# Patient Record
Sex: Male | Born: 1950 | Race: White | Hispanic: No | Marital: Single | Smoking: Former smoker
Health system: Southern US, Community
[De-identification: ages and names within clinical notes are randomized; demographics above are authoritative.]

## PROBLEM LIST (undated history)

## (undated) DIAGNOSIS — I82403 Acute embolism and thrombosis of unspecified deep veins of lower extremity, bilateral: Secondary | ICD-10-CM

## (undated) DIAGNOSIS — K219 Gastro-esophageal reflux disease without esophagitis: Secondary | ICD-10-CM

## (undated) DIAGNOSIS — M48062 Spinal stenosis, lumbar region with neurogenic claudication: Secondary | ICD-10-CM

## (undated) DIAGNOSIS — M109 Gout, unspecified: Secondary | ICD-10-CM

## (undated) DIAGNOSIS — F10939 Alcohol use, unspecified with withdrawal, unspecified: Secondary | ICD-10-CM

## (undated) DIAGNOSIS — F10239 Alcohol dependence with withdrawal, unspecified: Secondary | ICD-10-CM

## (undated) DIAGNOSIS — G9389 Other specified disorders of brain: Secondary | ICD-10-CM

## (undated) DIAGNOSIS — I480 Paroxysmal atrial fibrillation: Secondary | ICD-10-CM

## (undated) DIAGNOSIS — I10 Essential (primary) hypertension: Secondary | ICD-10-CM

## (undated) DIAGNOSIS — F1010 Alcohol abuse, uncomplicated: Secondary | ICD-10-CM

## (undated) DIAGNOSIS — I2510 Atherosclerotic heart disease of native coronary artery without angina pectoris: Secondary | ICD-10-CM

## (undated) DIAGNOSIS — I714 Abdominal aortic aneurysm, without rupture, unspecified: Secondary | ICD-10-CM

## (undated) DIAGNOSIS — C159 Malignant neoplasm of esophagus, unspecified: Secondary | ICD-10-CM

## (undated) DIAGNOSIS — I77810 Thoracic aortic ectasia: Secondary | ICD-10-CM

## (undated) DIAGNOSIS — R569 Unspecified convulsions: Secondary | ICD-10-CM

## (undated) DIAGNOSIS — E538 Deficiency of other specified B group vitamins: Secondary | ICD-10-CM

---

## 2011-08-31 HISTORY — PX: ESOPHAGECTOMY: SUR457

## 2015-08-31 DIAGNOSIS — G9389 Other specified disorders of brain: Secondary | ICD-10-CM

## 2015-08-31 HISTORY — DX: Other specified disorders of brain: G93.89

## 2016-03-30 DIAGNOSIS — I82403 Acute embolism and thrombosis of unspecified deep veins of lower extremity, bilateral: Secondary | ICD-10-CM

## 2016-03-30 HISTORY — DX: Acute embolism and thrombosis of unspecified deep veins of lower extremity, bilateral: I82.403

## 2016-06-11 HISTORY — PX: IVC FILTER INSERTION: CATH118245

## 2017-02-17 ENCOUNTER — Other Ambulatory Visit (HOSPITAL_COMMUNITY): Payer: Medicare Other

## 2017-02-17 ENCOUNTER — Inpatient Hospital Stay
Admit: 2017-02-17 | Discharge: 2017-03-08 | Disposition: A | Discharge status: Rehabilitation Facility | Payer: Medicare Other | Attending: Internal Medicine | Admitting: Internal Medicine

## 2017-02-17 DIAGNOSIS — Z4659 Encounter for fitting and adjustment of other gastrointestinal appliance and device: Secondary | ICD-10-CM

## 2017-02-17 DIAGNOSIS — J9690 Respiratory failure, unspecified, unspecified whether with hypoxia or hypercapnia: Secondary | ICD-10-CM

## 2017-02-17 HISTORY — DX: Acute embolism and thrombosis of unspecified deep veins of lower extremity, bilateral: I82.403

## 2017-02-17 HISTORY — DX: Gout, unspecified: M10.9

## 2017-02-17 HISTORY — DX: Abdominal aortic aneurysm, without rupture: I71.4

## 2017-02-17 HISTORY — DX: Paroxysmal atrial fibrillation: I48.0

## 2017-02-17 HISTORY — DX: Spinal stenosis, lumbar region with neurogenic claudication: M48.062

## 2017-02-17 HISTORY — DX: Thoracic aortic ectasia: I77.810

## 2017-02-17 HISTORY — DX: Alcohol use, unspecified with withdrawal, unspecified: F10.939

## 2017-02-17 HISTORY — DX: Deficiency of other specified B group vitamins: E53.8

## 2017-02-17 HISTORY — DX: Abdominal aortic aneurysm, without rupture, unspecified: I71.40

## 2017-02-17 HISTORY — DX: Malignant neoplasm of esophagus, unspecified: C15.9

## 2017-02-17 HISTORY — DX: Alcohol abuse, uncomplicated: F10.10

## 2017-02-17 HISTORY — DX: Other specified disorders of brain: G93.89

## 2017-02-17 HISTORY — DX: Unspecified convulsions: F10.239

## 2017-02-17 HISTORY — DX: Unspecified convulsions: R56.9

## 2017-02-17 HISTORY — DX: Essential (primary) hypertension: I10

## 2017-02-17 HISTORY — DX: Gastro-esophageal reflux disease without esophagitis: K21.9

## 2017-02-17 HISTORY — DX: Atherosclerotic heart disease of native coronary artery without angina pectoris: I25.10

## 2017-02-17 LAB — BLOOD GAS, ARTERIAL
ACID-BASE EXCESS: 4.8 mmol/L — AB (ref 0.0–2.0)
Bicarbonate: 28.6 mmol/L — ABNORMAL HIGH (ref 20.0–28.0)
FIO2: 35
O2 SAT: 97.1 %
PCO2 ART: 41.2 mmHg (ref 32.0–48.0)
PH ART: 7.456 — AB (ref 7.350–7.450)
PO2 ART: 88.3 mmHg (ref 83.0–108.0)
Patient temperature: 98.6

## 2017-02-18 ENCOUNTER — Other Ambulatory Visit (HOSPITAL_COMMUNITY): Payer: Medicare Other

## 2017-02-18 LAB — CBC
HEMATOCRIT: 26.6 % — AB (ref 39.0–52.0)
HEMOGLOBIN: 8.5 g/dL — AB (ref 13.0–17.0)
MCH: 30.8 pg (ref 26.0–34.0)
MCHC: 32 g/dL (ref 30.0–36.0)
MCV: 96.4 fL (ref 78.0–100.0)
Platelets: 375 10*3/uL (ref 150–400)
RBC: 2.76 MIL/uL — AB (ref 4.22–5.81)
RDW: 16.3 % — ABNORMAL HIGH (ref 11.5–15.5)
WBC: 6.5 10*3/uL (ref 4.0–10.5)

## 2017-02-18 LAB — COMPREHENSIVE METABOLIC PANEL
ALT: 10 U/L — ABNORMAL LOW (ref 17–63)
AST: 22 U/L (ref 15–41)
Albumin: 2.2 g/dL — ABNORMAL LOW (ref 3.5–5.0)
Alkaline Phosphatase: 135 U/L — ABNORMAL HIGH (ref 38–126)
Anion gap: 7 (ref 5–15)
BUN: 6 mg/dL (ref 6–20)
CHLORIDE: 95 mmol/L — AB (ref 101–111)
CO2: 28 mmol/L (ref 22–32)
Calcium: 8.7 mg/dL — ABNORMAL LOW (ref 8.9–10.3)
Creatinine, Ser: 0.52 mg/dL — ABNORMAL LOW (ref 0.61–1.24)
GFR calc non Af Amer: >60 mL/min (ref >60)
Glucose, Bld: 93 mg/dL (ref 65–99)
POTASSIUM: 4 mmol/L (ref 3.5–5.1)
SODIUM: 130 mmol/L — AB (ref 135–145)
Total Bilirubin: 0.7 mg/dL (ref 0.3–1.2)
Total Protein: 7.2 g/dL (ref 6.5–8.1)

## 2017-02-18 MED ORDER — IOPAMIDOL (ISOVUE-300) INJECTION 61%
INTRAVENOUS | Status: AC
  Filled 2017-02-18: qty 50

## 2017-02-18 MED ORDER — LIDOCAINE VISCOUS 2 % MT SOLN
15.0000 mL | Freq: Once | OROMUCOSAL | Status: AC
  Administered 2017-02-18: 3 mL via OROMUCOSAL

## 2017-02-18 MED ORDER — LIDOCAINE VISCOUS 2 % MT SOLN
OROMUCOSAL | Status: AC
  Filled 2017-02-18: qty 15

## 2017-02-18 MED ORDER — IOPAMIDOL (ISOVUE-300) INJECTION 61%
50.0000 mL | Freq: Once | INTRAVENOUS | Status: AC | PRN
  Administered 2017-02-18: 10 mL via ORAL

## 2017-02-24 ENCOUNTER — Other Ambulatory Visit (HOSPITAL_COMMUNITY): Payer: Medicare Other

## 2017-02-24 LAB — COMPREHENSIVE METABOLIC PANEL
ALBUMIN: 2.2 g/dL — AB (ref 3.5–5.0)
ALK PHOS: 133 U/L — AB (ref 38–126)
ALT: 14 U/L — ABNORMAL LOW (ref 17–63)
ANION GAP: 9 (ref 5–15)
AST: 22 U/L (ref 15–41)
BUN: 15 mg/dL (ref 6–20)
CALCIUM: 8.7 mg/dL — AB (ref 8.9–10.3)
CO2: 28 mmol/L (ref 22–32)
Chloride: 93 mmol/L — ABNORMAL LOW (ref 101–111)
Creatinine, Ser: 0.56 mg/dL — ABNORMAL LOW (ref 0.61–1.24)
GFR calc non Af Amer: >60 mL/min (ref >60)
Glucose, Bld: 101 mg/dL — ABNORMAL HIGH (ref 65–99)
POTASSIUM: 4.1 mmol/L (ref 3.5–5.1)
SODIUM: 130 mmol/L — AB (ref 135–145)
Total Bilirubin: 0.4 mg/dL (ref 0.3–1.2)
Total Protein: 7.6 g/dL (ref 6.5–8.1)

## 2017-02-24 LAB — CBC
HEMATOCRIT: 26.7 % — AB (ref 39.0–52.0)
Hemoglobin: 8.4 g/dL — ABNORMAL LOW (ref 13.0–17.0)
MCH: 30.1 pg (ref 26.0–34.0)
MCHC: 31.5 g/dL (ref 30.0–36.0)
MCV: 95.7 fL (ref 78.0–100.0)
PLATELETS: 297 10*3/uL (ref 150–400)
RBC: 2.79 MIL/uL — AB (ref 4.22–5.81)
RDW: 16.1 % — AB (ref 11.5–15.5)
WBC: 4.7 10*3/uL (ref 4.0–10.5)

## 2017-02-25 LAB — BASIC METABOLIC PANEL
ANION GAP: 5 (ref 5–15)
BUN: 14 mg/dL (ref 6–20)
CHLORIDE: 94 mmol/L — AB (ref 101–111)
CO2: 31 mmol/L (ref 22–32)
Calcium: 8.6 mg/dL — ABNORMAL LOW (ref 8.9–10.3)
Creatinine, Ser: 0.56 mg/dL — ABNORMAL LOW (ref 0.61–1.24)
GFR calc non Af Amer: >60 mL/min (ref >60)
Glucose, Bld: 120 mg/dL — ABNORMAL HIGH (ref 65–99)
POTASSIUM: 4.2 mmol/L (ref 3.5–5.1)
Sodium: 130 mmol/L — ABNORMAL LOW (ref 135–145)

## 2017-03-01 ENCOUNTER — Other Ambulatory Visit (HOSPITAL_COMMUNITY): Payer: Medicare Other

## 2017-03-01 LAB — BASIC METABOLIC PANEL
ANION GAP: 7 (ref 5–15)
BUN: 13 mg/dL (ref 6–20)
CHLORIDE: 101 mmol/L (ref 101–111)
CO2: 28 mmol/L (ref 22–32)
Calcium: 8.8 mg/dL — ABNORMAL LOW (ref 8.9–10.3)
Creatinine, Ser: 0.49 mg/dL — ABNORMAL LOW (ref 0.61–1.24)
GFR calc Af Amer: >60 mL/min (ref >60)
GFR calc non Af Amer: >60 mL/min (ref >60)
GLUCOSE: 93 mg/dL (ref 65–99)
POTASSIUM: 3.8 mmol/L (ref 3.5–5.1)
Sodium: 136 mmol/L (ref 135–145)

## 2017-03-01 LAB — CBC
HEMATOCRIT: 28.9 % — AB (ref 39.0–52.0)
HEMOGLOBIN: 8.9 g/dL — AB (ref 13.0–17.0)
MCH: 30 pg (ref 26.0–34.0)
MCHC: 30.8 g/dL (ref 30.0–36.0)
MCV: 97.3 fL (ref 78.0–100.0)
Platelets: 244 10*3/uL (ref 150–400)
RBC: 2.97 MIL/uL — AB (ref 4.22–5.81)
RDW: 16.4 % — ABNORMAL HIGH (ref 11.5–15.5)
WBC: 3.1 10*3/uL — AB (ref 4.0–10.5)

## 2017-03-08 ENCOUNTER — Encounter (HOSPITAL_COMMUNITY): Payer: Self-pay

## 2017-03-08 ENCOUNTER — Encounter: Payer: Self-pay | Admitting: Physical Medicine and Rehabilitation

## 2017-03-08 ENCOUNTER — Inpatient Hospital Stay (HOSPITAL_COMMUNITY)
Admission: RE | Admit: 2017-03-08 | Discharge: 2017-03-19 | DRG: 560 | Disposition: A | Discharge status: Home Health | Payer: Medicare Other | Source: Intra-hospital | Attending: Physical Medicine & Rehabilitation | Admitting: Physical Medicine & Rehabilitation

## 2017-03-08 DIAGNOSIS — S32059D Unspecified fracture of fifth lumbar vertebra, subsequent encounter for fracture with routine healing: Secondary | ICD-10-CM

## 2017-03-08 DIAGNOSIS — I714 Abdominal aortic aneurysm, without rupture: Secondary | ICD-10-CM | POA: Diagnosis present

## 2017-03-08 DIAGNOSIS — S12500D Unspecified displaced fracture of sixth cervical vertebra, subsequent encounter for fracture with routine healing: Principal | ICD-10-CM

## 2017-03-08 DIAGNOSIS — I2510 Atherosclerotic heart disease of native coronary artery without angina pectoris: Secondary | ICD-10-CM | POA: Diagnosis present

## 2017-03-08 DIAGNOSIS — Z95828 Presence of other vascular implants and grafts: Secondary | ICD-10-CM

## 2017-03-08 DIAGNOSIS — F329 Major depressive disorder, single episode, unspecified: Secondary | ICD-10-CM | POA: Diagnosis present

## 2017-03-08 DIAGNOSIS — S42002D Fracture of unspecified part of left clavicle, subsequent encounter for fracture with routine healing: Secondary | ICD-10-CM | POA: Diagnosis not present

## 2017-03-08 DIAGNOSIS — Z981 Arthrodesis status: Secondary | ICD-10-CM | POA: Diagnosis not present

## 2017-03-08 DIAGNOSIS — K219 Gastro-esophageal reflux disease without esophagitis: Secondary | ICD-10-CM | POA: Diagnosis present

## 2017-03-08 DIAGNOSIS — F419 Anxiety disorder, unspecified: Secondary | ICD-10-CM | POA: Diagnosis present

## 2017-03-08 DIAGNOSIS — R630 Anorexia: Secondary | ICD-10-CM

## 2017-03-08 DIAGNOSIS — I480 Paroxysmal atrial fibrillation: Secondary | ICD-10-CM | POA: Diagnosis present

## 2017-03-08 DIAGNOSIS — M109 Gout, unspecified: Secondary | ICD-10-CM | POA: Diagnosis present

## 2017-03-08 DIAGNOSIS — I959 Hypotension, unspecified: Secondary | ICD-10-CM

## 2017-03-08 DIAGNOSIS — D62 Acute posthemorrhagic anemia: Secondary | ICD-10-CM | POA: Diagnosis present

## 2017-03-08 DIAGNOSIS — S32039D Unspecified fracture of third lumbar vertebra, subsequent encounter for fracture with routine healing: Secondary | ICD-10-CM

## 2017-03-08 DIAGNOSIS — F17210 Nicotine dependence, cigarettes, uncomplicated: Secondary | ICD-10-CM | POA: Diagnosis present

## 2017-03-08 DIAGNOSIS — I82622 Acute embolism and thrombosis of deep veins of left upper extremity: Secondary | ICD-10-CM

## 2017-03-08 DIAGNOSIS — D709 Neutropenia, unspecified: Secondary | ICD-10-CM | POA: Diagnosis present

## 2017-03-08 DIAGNOSIS — I10 Essential (primary) hypertension: Secondary | ICD-10-CM | POA: Diagnosis present

## 2017-03-08 DIAGNOSIS — S32049D Unspecified fracture of fourth lumbar vertebra, subsequent encounter for fracture with routine healing: Secondary | ICD-10-CM | POA: Diagnosis not present

## 2017-03-08 DIAGNOSIS — F1010 Alcohol abuse, uncomplicated: Secondary | ICD-10-CM | POA: Diagnosis present

## 2017-03-08 DIAGNOSIS — Z86718 Personal history of other venous thrombosis and embolism: Secondary | ICD-10-CM

## 2017-03-08 DIAGNOSIS — T07XXXA Unspecified multiple injuries, initial encounter: Secondary | ICD-10-CM | POA: Diagnosis not present

## 2017-03-08 DIAGNOSIS — D72819 Decreased white blood cell count, unspecified: Secondary | ICD-10-CM

## 2017-03-08 DIAGNOSIS — S32029D Unspecified fracture of second lumbar vertebra, subsequent encounter for fracture with routine healing: Secondary | ICD-10-CM | POA: Diagnosis not present

## 2017-03-08 DIAGNOSIS — R5381 Other malaise: Secondary | ICD-10-CM | POA: Diagnosis present

## 2017-03-08 DIAGNOSIS — R7989 Other specified abnormal findings of blood chemistry: Secondary | ICD-10-CM

## 2017-03-08 LAB — BASIC METABOLIC PANEL
Anion gap: 9 (ref 5–15)
BUN: 16 mg/dL (ref 6–20)
CHLORIDE: 102 mmol/L (ref 101–111)
CO2: 24 mmol/L (ref 22–32)
CREATININE: 0.76 mg/dL (ref 0.61–1.24)
Calcium: 9 mg/dL (ref 8.9–10.3)
GFR calc Af Amer: >60 mL/min (ref >60)
GFR calc non Af Amer: >60 mL/min (ref >60)
GLUCOSE: 85 mg/dL (ref 65–99)
POTASSIUM: 3.9 mmol/L (ref 3.5–5.1)
SODIUM: 135 mmol/L (ref 135–145)

## 2017-03-08 LAB — CBC
HCT: 32.9 % — ABNORMAL LOW (ref 39.0–52.0)
HEMOGLOBIN: 10 g/dL — AB (ref 13.0–17.0)
MCH: 28.8 pg (ref 26.0–34.0)
MCHC: 30.4 g/dL (ref 30.0–36.0)
MCV: 94.8 fL (ref 78.0–100.0)
Platelets: 182 10*3/uL (ref 150–400)
RBC: 3.47 MIL/uL — ABNORMAL LOW (ref 4.22–5.81)
RDW: 16.1 % — ABNORMAL HIGH (ref 11.5–15.5)
WBC: 3.2 10*3/uL — ABNORMAL LOW (ref 4.0–10.5)

## 2017-03-08 MED ORDER — PROCHLORPERAZINE EDISYLATE 5 MG/ML IJ SOLN: 5.0000 mg | Freq: Four times a day (QID) | INTRAMUSCULAR | Status: DC | PRN

## 2017-03-08 MED ORDER — METHOCARBAMOL 750 MG PO TABS
750.0000 mg | ORAL_TABLET | Freq: Four times a day (QID) | ORAL | Status: DC
Start: 2017-03-08 — End: 2017-03-19
  Administered 2017-03-08 – 2017-03-19 (×36): 750 mg via ORAL
  Filled 2017-03-08 (×37): qty 1

## 2017-03-08 MED ORDER — PROCHLORPERAZINE MALEATE 5 MG PO TABS
5.0000 mg | ORAL_TABLET | Freq: Four times a day (QID) | ORAL | Status: DC | PRN
  Administered 2017-03-17: 10 mg via ORAL
  Filled 2017-03-08 (×2): qty 2

## 2017-03-08 MED ORDER — FAMOTIDINE 20 MG PO TABS
20.0000 mg | ORAL_TABLET | Freq: Two times a day (BID) | ORAL | Status: DC
  Administered 2017-03-08 – 2017-03-19 (×22): 20 mg via ORAL
  Filled 2017-03-08 (×22): qty 1

## 2017-03-08 MED ORDER — GUAIFENESIN-DM 100-10 MG/5ML PO SYRP: 5.0000 mL | ORAL_SOLUTION | Freq: Four times a day (QID) | ORAL | Status: DC | PRN

## 2017-03-08 MED ORDER — FOLIC ACID 1 MG PO TABS
1.0000 mg | ORAL_TABLET | Freq: Every day | ORAL | Status: DC
  Administered 2017-03-09 – 2017-03-19 (×11): 1 mg via ORAL
  Filled 2017-03-08 (×11): qty 1

## 2017-03-08 MED ORDER — PRO-STAT SUGAR FREE PO LIQD
30.0000 mL | Freq: Two times a day (BID) | ORAL | Status: DC
  Administered 2017-03-08 – 2017-03-16 (×16): 30 mL via ORAL
  Filled 2017-03-08 (×16): qty 30

## 2017-03-08 MED ORDER — QUETIAPINE FUMARATE 50 MG PO TABS
50.0000 mg | ORAL_TABLET | Freq: Every day | ORAL | Status: DC
  Administered 2017-03-08 – 2017-03-18 (×11): 50 mg via ORAL
  Filled 2017-03-08 (×10): qty 1

## 2017-03-08 MED ORDER — TRAMADOL HCL 50 MG PO TABS: 50.0000 mg | ORAL_TABLET | Freq: Four times a day (QID) | ORAL | Status: DC | PRN

## 2017-03-08 MED ORDER — DIPHENHYDRAMINE HCL 12.5 MG/5ML PO ELIX: 12.5000 mg | ORAL_SOLUTION | Freq: Four times a day (QID) | ORAL | Status: DC | PRN

## 2017-03-08 MED ORDER — BISACODYL 10 MG RE SUPP: 10.0000 mg | Freq: Every day | RECTAL | Status: DC | PRN

## 2017-03-08 MED ORDER — PAROXETINE HCL 20 MG PO TABS
20.0000 mg | ORAL_TABLET | Freq: Every day | ORAL | Status: DC
  Administered 2017-03-09 – 2017-03-19 (×11): 20 mg via ORAL
  Filled 2017-03-08 (×9): qty 1
  Filled 2017-03-08: qty 2
  Filled 2017-03-08 (×2): qty 1

## 2017-03-08 MED ORDER — MIDODRINE HCL 5 MG PO TABS
5.0000 mg | ORAL_TABLET | Freq: Three times a day (TID) | ORAL | Status: DC
  Administered 2017-03-09 – 2017-03-19 (×31): 5 mg via ORAL
  Filled 2017-03-08 (×31): qty 1

## 2017-03-08 MED ORDER — POLYETHYLENE GLYCOL 3350 17 G PO PACK
17.0000 g | PACK | Freq: Every day | ORAL | Status: DC
  Administered 2017-03-09 – 2017-03-18 (×5): 17 g via ORAL
  Filled 2017-03-08 (×10): qty 1

## 2017-03-08 MED ORDER — CLONAZEPAM 0.5 MG PO TABS
0.2500 mg | ORAL_TABLET | Freq: Two times a day (BID) | ORAL | Status: DC
  Administered 2017-03-08 – 2017-03-19 (×22): 0.25 mg via ORAL
  Filled 2017-03-08 (×23): qty 1

## 2017-03-08 MED ORDER — POLYETHYLENE GLYCOL 3350 17 G PO PACK: 17.0000 g | PACK | Freq: Every day | ORAL | Status: DC | PRN

## 2017-03-08 MED ORDER — AMIODARONE HCL 200 MG PO TABS
100.0000 mg | ORAL_TABLET | Freq: Every day | ORAL | Status: DC
  Administered 2017-03-09 – 2017-03-19 (×11): 100 mg via ORAL
  Filled 2017-03-08 (×11): qty 1

## 2017-03-08 MED ORDER — PROCHLORPERAZINE 25 MG RE SUPP
12.5000 mg | Freq: Four times a day (QID) | RECTAL | Status: DC | PRN
  Filled 2017-03-08: qty 1

## 2017-03-08 MED ORDER — SACCHAROMYCES BOULARDII 250 MG PO CAPS
250.0000 mg | ORAL_CAPSULE | Freq: Two times a day (BID) | ORAL | Status: DC
  Administered 2017-03-08 – 2017-03-19 (×22): 250 mg via ORAL
  Filled 2017-03-08 (×23): qty 1

## 2017-03-08 MED ORDER — TAB-A-VITE/IRON PO TABS
1.0000 | ORAL_TABLET | Freq: Every day | ORAL | Status: DC
  Administered 2017-03-09 – 2017-03-19 (×11): 1 via ORAL
  Filled 2017-03-08 (×11): qty 1

## 2017-03-08 MED ORDER — OXYCODONE HCL 5 MG PO TABS
5.0000 mg | ORAL_TABLET | Freq: Four times a day (QID) | ORAL | Status: DC | PRN
  Administered 2017-03-14: 5 mg via ORAL
  Filled 2017-03-08: qty 1

## 2017-03-08 MED ORDER — NAPROXEN 250 MG PO TABS
500.0000 mg | ORAL_TABLET | Freq: Two times a day (BID) | ORAL | Status: DC
  Administered 2017-03-09 – 2017-03-19 (×20): 500 mg via ORAL
  Filled 2017-03-08 (×21): qty 2

## 2017-03-08 MED ORDER — ALLOPURINOL 300 MG PO TABS
300.0000 mg | ORAL_TABLET | Freq: Every day | ORAL | Status: DC
  Administered 2017-03-09 – 2017-03-19 (×11): 300 mg via ORAL
  Filled 2017-03-08 (×4): qty 1
  Filled 2017-03-08: qty 3
  Filled 2017-03-08 (×6): qty 1

## 2017-03-08 MED ORDER — NICOTINE 21 MG/24HR TD PT24
21.0000 mg | MEDICATED_PATCH | Freq: Every day | TRANSDERMAL | Status: DC
  Administered 2017-03-09 – 2017-03-13 (×3): 21 mg via TRANSDERMAL
  Filled 2017-03-08 (×8): qty 1

## 2017-03-08 MED ORDER — VITAMIN B-1 100 MG PO TABS
100.0000 mg | ORAL_TABLET | Freq: Every day | ORAL | Status: DC
  Administered 2017-03-09 – 2017-03-19 (×11): 100 mg via ORAL
  Filled 2017-03-08 (×11): qty 1

## 2017-03-08 MED ORDER — APIXABAN 5 MG PO TABS
5.0000 mg | ORAL_TABLET | Freq: Two times a day (BID) | ORAL | Status: DC
  Administered 2017-03-08 – 2017-03-19 (×22): 5 mg via ORAL
  Filled 2017-03-08 (×22): qty 1

## 2017-03-08 MED ORDER — ACETAMINOPHEN 325 MG PO TABS
325.0000 mg | ORAL_TABLET | ORAL | Status: DC | PRN
  Filled 2017-03-08: qty 2

## 2017-03-08 MED ORDER — ALUM & MAG HYDROXIDE-SIMETH 200-200-20 MG/5ML PO SUSP: 30.0000 mL | ORAL | Status: DC | PRN

## 2017-03-08 MED ORDER — COLCHICINE 0.6 MG PO TABS
0.6000 mg | ORAL_TABLET | Freq: Two times a day (BID) | ORAL | Status: DC
  Administered 2017-03-08 – 2017-03-19 (×22): 0.6 mg via ORAL
  Filled 2017-03-08 (×22): qty 1

## 2017-03-08 MED ORDER — FLEET ENEMA 7-19 GM/118ML RE ENEM: 1.0000 | ENEMA | Freq: Once | RECTAL | Status: DC | PRN
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
## 2017-03-09 ENCOUNTER — Inpatient Hospital Stay (HOSPITAL_COMMUNITY): Payer: Medicare Other | Admitting: Occupational Therapy

## 2017-03-09 ENCOUNTER — Inpatient Hospital Stay (HOSPITAL_COMMUNITY): Payer: Medicare Other | Admitting: Physical Therapy

## 2017-03-09 ENCOUNTER — Encounter (HOSPITAL_COMMUNITY): Payer: Self-pay

## 2017-03-09 DIAGNOSIS — D72819 Decreased white blood cell count, unspecified: Secondary | ICD-10-CM

## 2017-03-09 DIAGNOSIS — R630 Anorexia: Secondary | ICD-10-CM

## 2017-03-09 DIAGNOSIS — I959 Hypotension, unspecified: Secondary | ICD-10-CM

## 2017-03-09 DIAGNOSIS — D62 Acute posthemorrhagic anemia: Secondary | ICD-10-CM

## 2017-03-09 LAB — COMPREHENSIVE METABOLIC PANEL WITH GFR
ALT: 19 U/L (ref 17–63)
AST: 31 U/L (ref 15–41)
Albumin: 2.9 g/dL — ABNORMAL LOW (ref 3.5–5.0)
Alkaline Phosphatase: 134 U/L — ABNORMAL HIGH (ref 38–126)
Anion gap: 7 (ref 5–15)
BUN: 17 mg/dL (ref 6–20)
CO2: 26 mmol/L (ref 22–32)
Calcium: 9.1 mg/dL (ref 8.9–10.3)
Chloride: 103 mmol/L (ref 101–111)
Creatinine, Ser: 0.73 mg/dL (ref 0.61–1.24)
GFR calc Af Amer: >60 mL/min
GFR calc non Af Amer: >60 mL/min
Glucose, Bld: 79 mg/dL (ref 65–99)
Potassium: 3.8 mmol/L (ref 3.5–5.1)
Sodium: 136 mmol/L (ref 135–145)
Total Bilirubin: 0.6 mg/dL (ref 0.3–1.2)
Total Protein: 7.9 g/dL (ref 6.5–8.1)

## 2017-03-09 LAB — CBC WITH DIFFERENTIAL/PLATELET
Basophils Absolute: 0 K/uL (ref 0.0–0.1)
Basophils Relative: 0 %
Eosinophils Absolute: 0.4 K/uL (ref 0.0–0.7)
Eosinophils Relative: 11 %
HCT: 32.8 % — ABNORMAL LOW (ref 39.0–52.0)
Hemoglobin: 10.1 g/dL — ABNORMAL LOW (ref 13.0–17.0)
Lymphocytes Relative: 39 %
Lymphs Abs: 1.2 K/uL (ref 0.7–4.0)
MCH: 29.4 pg (ref 26.0–34.0)
MCHC: 30.8 g/dL (ref 30.0–36.0)
MCV: 95.6 fL (ref 78.0–100.0)
Monocytes Absolute: 0.2 K/uL (ref 0.1–1.0)
Monocytes Relative: 6 %
Neutro Abs: 1.4 K/uL — ABNORMAL LOW (ref 1.7–7.7)
Neutrophils Relative %: 44 %
Platelets: 174 K/uL (ref 150–400)
RBC: 3.43 MIL/uL — ABNORMAL LOW (ref 4.22–5.81)
RDW: 16.4 % — ABNORMAL HIGH (ref 11.5–15.5)
WBC: 3.2 K/uL — ABNORMAL LOW (ref 4.0–10.5)

## 2017-03-09 MED ORDER — MEGESTROL ACETATE 40 MG PO TABS
40.0000 mg | ORAL_TABLET | Freq: Every day | ORAL | Status: DC
  Administered 2017-03-09 – 2017-03-19 (×11): 40 mg via ORAL
  Filled 2017-03-09 (×3): qty 1
  Filled 2017-03-09: qty 2
  Filled 2017-03-09 (×9): qty 1

## 2017-03-09 NOTE — Progress Notes (Signed)
Occupational Therapy Session Note  Patient Details  Name: Alec Rivers MRN: [id number] Date of Birth: 12/29/1950  Today's Date: 03/09/2017 OT Individual Time: 1100-1130 OT Individual Time Calculation (min): 30 min    Skilled Therapeutic Interventions/Progress Updates:    1:1 Therapeutic activity with focus on challenging balance, functional transfers and assessment of ROM and strength in UEs.  Pt able to perform stand pivot transfers with min A with more than reasonable amt of time.  Participated in [place] with many rest breaks and scoring a 22/56 indicating a high fall risk.  Pt with weakness in left hand with gross grasp but ROM (with neck restrictions) WFL. Pt transferred into bed at end of session with min A and steadying A to return to supine. Left with call bell.   Therapy Documentation Precautions:  Precautions Precautions: Fall Restrictions Weight Bearing Restrictions: Yes LUE Weight Bearing: Weight bearing as tolerated (Pt was NWB through 01/2017) General:   Vital Signs:   Pain:   ADL:   Vision   Perception    Praxis   Exercises:   Other Treatments:    See Function Navigator for Current Functional Status.   Therapy/Group: Individual Therapy  Willeen Cass [facility] 03/09/2017, 12:11 PM
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
## 2017-03-09 NOTE — Plan of Care (Signed)
Problem: RH BOWEL ELIMINATION Goal: RH STG MANAGE BOWEL WITH ASSISTANCE STG Manage Bowel with Assistance.  Outcome: Progressing Total assistance with cleaning and peri carel Goal: RH OTHER STG BOWEL ELIMINATION GOALS W/ASSIST Other STG Bowel Elimination Goals With Assistance.  Outcome: Progressing Total assistance

## 2017-03-09 NOTE — Progress Notes (Signed)
Occupational Therapy Session Note  Patient Details  Name: Alec Rivers MRN: [id number] Date of Birth: [birth year]  Today's Date: 03/09/2017 OT Individual Time: 0354-6568 OT Individual Time Calculation (min): 62 min    Short Term Goals: Week 1:  OT Short Term Goal 1 (Week 1): Pt will complete all bathing sit to stand with supervision. OT Short Term Goal 2 (Week 1): Pt will complete LB dressing sit to stand with supervision. OT Short Term Goal 3 (Week 1): Pt will complete toilet transfers with supervision using the RW for support. OT Short Term Goal 4 (Week 1): Pt will complete shower/tub transfers with min assist using RW and tub bench.   OT Short Term Goal 5 (Week 1): Pt will complete LUE FM/strengthening exercises with supervision.    Skilled Therapeutic Interventions/Progress Updates:   Pt completed toilet transfer with min hand held assist and also worked on grooming task of brushing his teeth at the sink in standing with min assist as well.  He needed seated rest break after completion of these tasks.  He was able to propel his wheelchair down to the therapy gym with supervision.  Once in the gym he worked on [employer] coordination and Retail banker.  He demonstrated grip strength on the left side of 13 Lbs and on the right of 52 lbs.  Nine hole peg test 41 secs on the right and 56 on the left.  Pt with theraputty in his room, will issue written exercises for this next session.  Returned to room at end of session with pt in bed and bed alarm in place.  Call button and phone in reach.    Therapy Documentation Precautions:  Precautions Precautions: Fall Required Braces or Orthoses: Cervical Brace Cervical Brace: Hard collar, At all times Restrictions Weight Bearing Restrictions: No LUE Weight Bearing: Weight bearing as tolerated  Pain: Pain Assessment Pain Assessment: No/denies pain ADL: See Function Navigator for Current Functional Status.   Therapy/Group: Individual  Therapy  Dale Strausser OTR/L 03/09/2017, 5:26 PM
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
## 2017-03-09 NOTE — Care Management Note (Signed)
Inpatient [place] Individual Statement of Services  Patient Name:  Alec Rivers  Date:  03/09/2017  Welcome to the [facility].  Our goal is to provide you with an individualized program based on your diagnosis and situation, designed to meet your specific needs.  With this comprehensive rehabilitation program, you will be expected to participate in at least 3 hours of rehabilitation therapies Monday-Friday, with modified therapy programming on the weekends.  Your rehabilitation program will include the following services:  Physical Therapy (PT), Occupational Therapy (OT), Speech Therapy (ST), 24 hour per day rehabilitation nursing, Neuropsychology, Case Management (Social Worker), Rehabilitation Medicine, Nutrition Services and Pharmacy Services  Weekly team conferences will be held on Wednesday to discuss your progress.  Your Social Worker will talk with you frequently to get your input and to update you on team discussions.  Team conferences with you and your family in attendance may also be held.  Expected length of stay: 7-10 days  Overall anticipated outcome: supervision set up  Depending on your progress and recovery, your program may change. Your Social Worker will coordinate services and will keep you informed of any changes. Your Social Worker's name and contact numbers are listed  below.  The following services may also be recommended but are not provided by the [facility] will be made to provide these services after discharge if needed.  Arrangements include referral to agencies that provide these services.  Your insurance has been verified to be:  Medicare Your primary doctor is:    Pertinent information will be shared with your doctor and your insurance company.  Social Worker:  Ovidio Kin, [place] or [phone]  Information discussed with and copy given to patient by: Elease Hashimoto, 03/09/2017, 1:27 PM

## 2017-03-09 NOTE — Discharge Instructions (Addendum)
Inpatient Rehab Discharge Instructions  Alec Rivers Discharge date and time:  03/18/17  Activities/Precautions/ Functional Status: Activity: no lifting, driving, or strenuous exercise for till cleared by MD Diet: cardiac diet--Need to drink at least 5 glasses of water daily.  Wound Care: keep wound clean and dry   Functional status:  ___ No restrictions     ___ Walk up steps independently ___ 24/7 supervision/assistance   ___ Walk up steps with assistance _X__ Intermittent supervision/assistance  __X_ Bathe/dress independently _X__ Walk with walker    ___ Bathe/dress with assistance ___ Walk Independently    ___ Shower independently ___ Walk with assistance    _X__ Shower with assistance _X__ No alcohol or tobacco    ___ Return to work/school ________  Special Instructions: 1. Take your medications as prescribed. Do not skip doses.  2. Wear collar at all times. Contact Neurosurgery for follow up appointment and input on when you can remove the collar.     COMMUNITY REFERRALS UPON DISCHARGE:    Home Health:   PT, OT, RN   Agency:[facility] IOXBD:[phone]   Date of last service:03/19/2017  Medical Equipment/Items Ordered:ROLLING WALKER, [place]   [phone]  OTHER: DECLINED ETOH RESOURCES  My questions have been answered and I understand these instructions. I will adhere to these goals and the provided educational materials after my discharge from the hospital.  Patient/Caregiver Signature _______________________________ Date __________  Clinician Signature _______________________________________ Date __________  Please bring this form and your medication list with you to all your follow-up doctor's appointments.   Information on my medicine - ELIQUIS (apixaban) This medication education was reviewed with me or my healthcare representative as part of my discharge preparation.    Why was  Eliquis prescribed for you? Eliquis was prescribed for you to reduce the risk of a blood clot forming that can cause a stroke if you have a medical condition called atrial fibrillation (a type of irregular heartbeat).  What do You need to know about Eliquis ? Take your Eliquis  5 mg TWICE DAILY - one tablet in the morning and one tablet in the evening with or without food. If you have difficulty swallowing the tablet whole please discuss with your pharmacist how to take the medication safely.  Take Eliquis exactly as prescribed by your doctor and DO NOT stop taking Eliquis without talking to the doctor who prescribed the medication.  Stopping may increase your risk of developing a stroke.  Refill your prescription before you run out.  After discharge, you should have regular check-up appointments with your healthcare provider that is prescribing your Eliquis.  In the future your dose may need to be changed if your kidney function or weight changes by a significant amount or as you get older.  What do you do if you miss a dose? If you miss a dose, take it as soon as you remember on the same day and resume taking twice daily.  Do not take more than one dose of ELIQUIS at the same time to make up a missed dose.  Important Safety Information A possible side effect of Eliquis is bleeding. You should call your healthcare provider right away if you experience any of the following: ? Bleeding from an injury or your nose that does not stop. ? Unusual colored urine (red or dark brown) or unusual colored stools (red or black). ? Unusual bruising for unknown reasons. ? A serious fall or if you  hit your head (even if there is no bleeding).  Some medicines may interact with Eliquis and might increase your risk of bleeding or clotting while on Eliquis. To help avoid this, consult your healthcare provider or pharmacist prior to using any new prescription or non-prescription medications, including  herbals, vitamins, non-steroidal anti-inflammatory drugs (NSAIDs) and supplements.  This website has more information on Eliquis (apixaban): http://www.eliquis.com/eliquis/home

## 2017-03-09 NOTE — Plan of Care (Signed)
Problem: RH BOWEL ELIMINATION Goal: RH STG MANAGE BOWEL WITH ASSISTANCE STG Manage Bowel with Assistance.  Outcome: Not Progressing Pt total requires total assist/incontinent

## 2017-03-10 ENCOUNTER — Inpatient Hospital Stay (HOSPITAL_COMMUNITY): Payer: Medicare Other | Admitting: *Deleted

## 2017-03-10 ENCOUNTER — Inpatient Hospital Stay (HOSPITAL_COMMUNITY): Payer: Medicare Other

## 2017-03-10 ENCOUNTER — Inpatient Hospital Stay (HOSPITAL_COMMUNITY): Payer: Medicare Other | Admitting: Occupational Therapy

## 2017-03-10 DIAGNOSIS — I82622 Acute embolism and thrombosis of deep veins of left upper extremity: Secondary | ICD-10-CM
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
## 2017-03-10 NOTE — Progress Notes (Signed)
Patient safety maintained throughout the shift.  Patient rested well, denies needs/concerns at this time.

## 2017-03-10 NOTE — Progress Notes (Signed)
Physical Therapy Session Note  Patient Details  Name: Alec Rivers MRN: [id number] Date of Birth: Dec 20, 1950  Today's Date: 03/10/2017 PT Individual Time: 0800-0900 PT Individual Time Calculation (min): 60 min   Short Term Goals: Week 1:  PT Short Term Goal 1 (Week 1): STG=LTG due to short length of stay   Skilled Therapeutic Interventions/Progress Updates:    Pt supine in bed upon PT arrival, agreeable to therapy tx. Pt transferred from supine to sitting EOB with supervision, HOB elevated and using handrails. Sitting edge of bed, worked on dynamic sitting balance in order to get dressed. Pt donned socks, shirt, pants and shoes with supervision. Min assist to stand and pull shorts up all the way. Pt transferred from bed to w/c stand step transfer with RW and supervision. Worked on LE strengthening, pt performed 2 x 10 LAQ and 2 x 10 seated marches. Pt ambulated x 109 ft using RW and min assist for balance, verbal cues for trunk extension and walker placement. Worked on navigating obstacles, ambulating around cones with RW, min assist for balance pt with difficulty turning, taking small steps and increased time. Worked on standing balance without UE support on foam 2 x30 sec with eyes open and 2 x 30 seconds with eyes closed, increased lateral sway with eyes closed. Pt left in w/c in room with call bell in reach.   Therapy Documentation Precautions:  Precautions Precautions: Fall Required Braces or Orthoses: Cervical Brace Cervical Brace: Hard collar, At all times Restrictions Weight Bearing Restrictions: No LUE Weight Bearing: Weight bearing as tolerated Pain:  Pt denies pain this session.    See Function Navigator for Current Functional Status.   Therapy/Group: Individual Therapy  Netta Corrigan, PT, DPT 03/10/2017, 8:32 AM
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
## 2017-03-10 NOTE — Progress Notes (Signed)
Spoke with Dr. Dois Davenport nurse and updated her on patient's progress. She reported that typical time in collar is 12 weeks and follow up prior to d/c of collar.  He can have it off when neck supported in chair  or HOB < 30 degrees for therapy only. Cannot be left alone without the collar.
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
## 2017-03-10 NOTE — Progress Notes (Addendum)
Occupational Therapy Session Note  Patient Details  Name: Alec Rivers MRN: [id number] Date of Birth: Sep 16, 1950  Today's Date: 03/10/2017 OT Individual Time: 1340-1457 OT Individual Time Calculation (min): 77 min    Short Term Goals: Week 1:  OT Short Term Goal 1 (Week 1): Pt will complete all bathing sit to stand with supervision. OT Short Term Goal 2 (Week 1): Pt will complete LB dressing sit to stand with supervision. OT Short Term Goal 3 (Week 1): Pt will complete toilet transfers with supervision using the RW for support. OT Short Term Goal 4 (Week 1): Pt will complete shower/tub transfers with min assist using RW and tub bench.   OT Short Term Goal 5 (Week 1): Pt will complete LUE FM/strengthening exercises with supervision.    Skilled Therapeutic Interventions/Progress Updates:    Pt received sitting up in w/c ready for OT treatment session. Focus of session on LUE fine motor control and strengthening. Issued Pt theraputty exercises with Pt reading and following instructions with Min verbal cues. Pt with theraputty from recent hospital stay, requires increased time to complete each exercise fully while seated in w/c. Pt requires intermittent rest breaks during activity completion due to increased L UE fatigue. Issued beads for use in theraputty with Pt grasping beads from table and pushing into putty using L UE. Pt then self propelled w/c approx 70 feet for UE strengthening towards dayroom with therapist assisting with transport to ultimately reach dayroom. Pt completed seated tabletop activity with small pvc pipe, using LUE to build figures from corresponding diagrams. Min verbal cues for utilizing L UE during completion. Transported Pt back to room in manner described above where Pt was left seated in w/c, call bell and needs within reach.     Therapy Documentation Precautions:  Precautions Precautions: Fall Required Braces or Orthoses: Cervical Brace Cervical Brace: Hard  collar, At all times Restrictions Weight Bearing Restrictions: No LUE Weight Bearing: Weight bearing as tolerated  Pain: Pain Assessment Pain Assessment: Faces Faces Pain Scale: No hurt Pain Type: Acute pain Pain Location: Hand Pain Orientation: Left Pain Descriptors / Indicators: Sore Pain Onset: With Activity Pain Intervention(s): Repositioned;Rest  See Function Navigator for Current Functional Status.   Therapy/Group: Individual Therapy  Raymondo Band 03/10/2017, 4:28 PM

## 2017-03-11 ENCOUNTER — Inpatient Hospital Stay (HOSPITAL_COMMUNITY): Payer: Medicare Other

## 2017-03-11 ENCOUNTER — Inpatient Hospital Stay (HOSPITAL_COMMUNITY): Payer: Medicare Other | Admitting: Occupational Therapy

## 2017-03-11 MED ORDER — ENSURE ENLIVE PO LIQD
237.0000 mL | Freq: Two times a day (BID) | ORAL | Status: DC
  Administered 2017-03-11 – 2017-03-18 (×13): 237 mL via ORAL
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
## 2017-03-11 NOTE — Progress Notes (Signed)
Occupational Therapy Session Note  Patient Details  Name: Alec Rivers MRN: [id number] Date of Birth: 11/06/1950  Today's Date: 03/11/2017 OT Individual Time: 5462-7035 OT Individual Time Calculation (min): 56 min    Short Term Goals: Week 1:  OT Short Term Goal 1 (Week 1): Pt will complete all bathing sit to stand with supervision. OT Short Term Goal 2 (Week 1): Pt will complete LB dressing sit to stand with supervision. OT Short Term Goal 3 (Week 1): Pt will complete toilet transfers with supervision using the RW for support. OT Short Term Goal 4 (Week 1): Pt will complete shower/tub transfers with min assist using RW and tub bench.   OT Short Term Goal 5 (Week 1): Pt will complete LUE FM/strengthening exercises with supervision.    Skilled Therapeutic Interventions/Progress Updates: Patient seated in w/c upon approach for OT.   Thought Patient stated he was very fatigued from yesterdays therapies, he concurred to work on seated and standing endurance via various venues such as 'backwards arm bicycles.'     He was able to complete sit to stand (somewhat stooped posture) activities for endurance training to increase functional mobility and self care - standing approximately 6 seconds before stating he was tired and needed to sit.   Patient took some time to swallow his meds in an applesauce container.   He stated that neither he nor his two sisters can swallow meds. And that he had "a load" of pills in his applesauce  Patient asked for this clinician to repeat words and when asked if needed this clinician to slow down, speak louder, lower pitch, repeat, or other, he stated, "I need for people to speak louder.   I really need hearing aids and will get some one of these days."  Patient asked to ly to rest at end of session.   He transferred with close S and exra time and was left with his call bell and phone within reach.     Therapy Documentation Precautions:   Precautions Precautions: Fall Required Braces or Orthoses: Cervical Brace Cervical Brace: Hard collar, At all times Restrictions Weight Bearing Restrictions: No LUE Weight Bearing: Weight bearing as tolerated  Pain:5/10 "only when I move it" received his scheduled pain meds    See Function Navigator for Current Functional Status.   Therapy/Group: Individual Therapy  Alfredia Ferguson [facility] 03/11/2017, 8:49 AM

## 2017-03-11 NOTE — Progress Notes (Signed)
[place] PHYSICAL MEDICINE & REHABILITATION     PROGRESS NOTE  Subjective/Complaints:  Pt seen sitting up in his chair this AM, working with therapies. He slept well overnight.  He has questions about his C-collar.   ROS: Denies CP, SOB, N/V/D.  Objective: Vital Signs: Blood pressure 114/79, pulse 78, temperature 98.4 F (36.9 C), temperature source Oral, resp. rate 16, height 6' (1.829 m), weight 77 kg (169 lb 12.1 oz), SpO2 96 %. No results found.  Recent Labs  03/09/17 0606  WBC 3.2*  HGB 10.1*  HCT 32.8*  PLT 174    Recent Labs  03/09/17 0606  NA 136  K 3.8  CL 103  GLUCOSE 79  BUN 17  CREATININE 0.73  CALCIUM 9.1   CBG (last 3)  No results for input(s): GLUCAP in the last 72 hours.  Wt Readings from Last 3 Encounters:  03/09/17 77 kg (169 lb 12.1 oz)  03/08/17 78.9 kg (174 lb)    Physical Exam:  BP 114/79 (BP Location: Left Arm)   Pulse 78   Temp 98.4 F (36.9 C) (Oral)   Resp 16   Ht 6' (1.829 m) Comment: 6 ft  Wt 77 kg (169 lb 12.1 oz)   SpO2 96%   BMI 23.02 kg/m  Constitutional: He appears well-developedand well-nourished. NAD. HENT: Normocephalicand atraumatic.  Eyes: EOMare normal. No discharge.  Neck: +Cervical collar in place Cardiovascular: RRR. +Murmurheard. No JVD. Respiratory: Effort normal breath sounds normal.  GI: Soft. Bowel sounds are normal.  Musculoskeletal: He exhibits no edemaor tenderness.  Neurological: He is alertand oriented.  Mild hearing loss.  Motor: RUE 4/5 prox to distal.  LUE; 4/5 proximal to distal (weaker than right with pain inhibition, stable) Bilateral LE: 3+/5 HF, KE and 4-/5 ADF/PF (stable).  Skin: Skin is warmand dry. BLE with stasis changes. Psychiatric: He has a normal mood and affect. His behavior is normal.   Assessment/Plan: 1. Functional deficits secondary to debility which require 3+ hours per day of interdisciplinary therapy in a comprehensive inpatient rehab setting. Physiatrist is  providing close team supervision and 24 hour management of active medical problems listed below. Physiatrist and rehab team continue to assess barriers to discharge/monitor patient progress toward functional and medical goals.  Function:  Bathing Bathing position   Position: Shower  Bathing parts Body parts bathed by patient: Right arm, Left arm, Chest, Abdomen, Front perineal area, Buttocks, Right upper leg, Left upper leg, Right lower leg, Left lower leg Body parts bathed by helper: Back  Bathing assist Assist Level: Touching or steadying assistance(Pt > 75%)      Upper Body Dressing/Undressing Upper body dressing   What is the patient wearing?: Pull over shirt/dress     Pull over shirt/dress - Perfomed by patient: Thread/unthread right sleeve, Thread/unthread left sleeve, Put head through opening, Pull shirt over trunk          Upper body assist        Lower Body Dressing/Undressing Lower body dressing   What is the patient wearing?: Pants, Socks, Shoes     Pants- Performed by patient: Thread/unthread right pants leg, Thread/unthread left pants leg, Pull pants up/down, Fasten/unfasten pants   Non-skid slipper socks- Performed by patient: Don/doff right sock, Don/doff left sock   Socks - Performed by patient: Don/doff right sock, Don/doff left sock   Shoes - Performed by patient: Don/doff right shoe, Don/doff left shoe, Fasten right, Fasten left  Lower body assist Assist for lower body dressing: Touching or steadying assistance (Pt > 75%)      Toileting Toileting   Toileting steps completed by patient: Adjust clothing prior to toileting, Performs perineal hygiene, Adjust clothing after toileting Toileting steps completed by helper: Adjust clothing after toileting Toileting Assistive Devices: Grab bar or rail  Toileting assist Assist level: Touching or steadying assistance (Pt.75%)   Transfers Chair/bed transfer   Chair/bed transfer method: Stand  pivot, Ambulatory Chair/bed transfer assist level: Touching or steadying assistance (Pt > 75%) Chair/bed transfer assistive device: Armrests, Medical sales representative     Max distance: 109 Assist level: Touching or steadying assistance (Pt > 75%)   Wheelchair   Type: Manual Max wheelchair distance: 75 Assist Level: Supervision or verbal cues  Cognition Comprehension Comprehension assist level: Follows basic conversation/direction with no assist  Expression Expression assist level: Expresses basic needs/ideas: With no assist  Social Interaction Social Interaction assist level: Interacts appropriately 90% of the time - Needs monitoring or encouragement for participation or interaction.  Problem Solving Problem solving assist level: Solves basic 90% of the time/requires cueing < 10% of the time  Memory Memory assist level: Recognizes or recalls 90% of the time/requires cueing < 10% of the time    Medical Problem List and Plan: 1. Functional and mobility deficitssecondary to polytrauma/debility after multiple medical  Cont CIR  C-collar for 12 weeks (~8/25), can take off with therapies only if supported  WBAT LUE 2. LLE/LUE DVT /Anticoagulation: Pharmaceutical: Other (comment)Eliquis 3. Pain Management: tylenol prn 4. Mood: LCSW to follow for evaluation and support 5. Neuropsych: This patient is not fullycapable of making decisions on hisown behalf. 6. Skin/Wound Care: Monitor stoma for healing. Maintain adequate nutritional and hydration status.  7. Fluids/Electrolytes/Nutrition: Monitor I/Os  BMP within acceptable range on 7/11  Megace started 7/11, slightly improved  Labs ordered for Monday 8. Hypotension: Monitor BP bid--continue midodrine  Controlled 7/13 9. A fib: In NSR on amiodarone. 10. Anxiety/depression: Managed by Paxil, Seroquel at bedtime and klonopin bid  11. H/o gout with flare ups: On allopurinol and Colcyrys  12. ABLA: Resolving. Will continue to  monitor.   Hb 10.1 on 7/11  Cont to monitor  Labs ordered for Monday 13. Leukopenia: Question medication related.   WBCs 3.2 on 7/11  Labs ordered for Monday  Cont to monitor   LOS (Days) 3 A FACE TO FACE EVALUATION WAS PERFORMED  Myracle Febres Lorie Phenix 03/11/2017 8:04 AM

## 2017-03-11 NOTE — Progress Notes (Signed)
Occupational Therapy Session Note  Patient Details  Name: Alec Rivers MRN: [id number] Date of Birth: June 30, 1951  Today's Date: 03/11/2017 OT Individual Time: 2919-1660 OT Individual Time Calculation (min): 56 min    Short Term Goals: Week 1:  OT Short Term Goal 1 (Week 1): Pt will complete all bathing sit to stand with supervision. OT Short Term Goal 2 (Week 1): Pt will complete LB dressing sit to stand with supervision. OT Short Term Goal 3 (Week 1): Pt will complete toilet transfers with supervision using the RW for support. OT Short Term Goal 4 (Week 1): Pt will complete shower/tub transfers with min assist using RW and tub bench.   OT Short Term Goal 5 (Week 1): Pt will complete LUE FM/strengthening exercises with supervision.    Skilled Therapeutic Interventions/Progress Updates:    1:1. No c/o pain, just "soreness." RN aware. Focus of session standing balance, endurance, LUE use, LUE FMC and ambulation with RW in context of ADL. Pt bathes at sit to stand level at sink with supervision for UB bathing and CGA for LB bathing. Pt dons UB clothing with superversion and LB clothing with CGA assuming seated figure 4 to thread pants/put on socks/shoes. Pt ambulates with RW throughout session with supervision for VC for RW management with 1 LOB to R as pt turns and trips over foot with MOD A to correct. Pt toilets with supervision and VC for safety awareness. Exited session with pt seated in w/c with call light in reach and set up for breakfast.  Therapy Documentation Precautions:  Precautions Precautions: Fall Required Braces or Orthoses: Cervical Brace Cervical Brace: Hard collar, At all times Restrictions Weight Bearing Restrictions: No LUE Weight Bearing: Weight bearing as tolerated  See Function Navigator for Current Functional Status.   Therapy/Group: Individual Therapy  Tonny Branch 03/11/2017, 9:05 AM

## 2017-03-11 NOTE — Progress Notes (Signed)
Physical Therapy Session Note  Patient Details  Name: Alec Rivers MRN: [id number] Date of Birth: [birth year]  Today's Date: 03/11/2017 PT Individual Time: 1030-1130 PT Individual Time Calculation (min): 60 min   Short Term Goals: Week 1:  PT Short Term Goal 1 (Week 1): STG=LTG due to short length of stay   Skilled Therapeutic Interventions/Progress Updates:    Pt supine in bed upon PT arrival, agreeable to therapy tx. Pt transferred from supine to sitting EOB with supervision, pt transferred from bed to w/c stand step with RW and min assist for balance. Pt propelled w/c 150 ft to gym using B LEs with supervision and increased time to complete. Pt transferred from w/c to nustep, stand step transfer without AD and min assist for balance. Pt used nustep x 6 min with B UEs and LEs to work on endurance and ROM. Pt standing without UE support worked on stepping in different directions forward, sideways and backwards in order to step on number called out to work on dynamic balance, min assist for balance. Worked on dynamic balance standing on foam without UE support while reaching in different directions with UEs and throwing/catching beach ball, pt with increased posterior lean, self corrected after verbal cues. Worked on stepping over objects and turning around cones on obstacle course, without AD and min assist for balance x 3. Pt with difficulty stepping over to clear hockey steps, improved when given verbal cues to take a larger step. Pt ascended/descended steps using single handrail, step to pattern with min assist for balance. Pt ambulated part way back to room x 65 ft and then rode in w/c rest of way d/t fatigue. Pt transferred from w/c back to bed, stand step transfer with min assist. Pt transferred from sitting EOB to supine with supervision. Pt left supine in bed with call bell in reach.   Pt denies pain this session   Therapy Documentation Precautions:  Precautions Precautions:  Fall Required Braces or Orthoses: Cervical Brace Cervical Brace: Hard collar, At all times Restrictions Weight Bearing Restrictions: No LUE Weight Bearing: Weight bearing as tolerated   See Function Navigator for Current Functional Status.   Therapy/Group: Individual Therapy  Netta Corrigan, PT, DPT 03/11/2017, 10:52 AM

## 2017-03-12 NOTE — Progress Notes (Signed)
Patient ID: Alec Rivers, male   DOB: Oct 19, 1950, 66 y.o.   MRN: [id number]   03/12/17.  Alec Rivers is a 66 y.o. male  Admit for CIR with functional and mobility deficitssecondary to polytrauma/debility   Subjective: No new complaints. No new problems. Slept well. Remains weak.  No focal complaints.  Past Medical History:  Diagnosis Date  . Abdominal aortic aneurysm (AAA), 30-34 mm diameter (HCC)   . Alcohol abuse   . Alcohol withdrawal seizure with complication ([place])   . Aortic root dilation (H[facility])   . CAD (coronary artery disease)   . DVT, bilateral lower limbs ([place]) 03/2016  . Esophageal adenocarcinoma ([place])    treated with chemoradiation  . GERD (gastroesophageal reflux disease)   . Gout   . HTN (hypertension)   . PAF (paroxysmal atrial fibrillation) ([place])    s/p ablation 2013  . Spinal stenosis, lumbar region with neurogenic claudication   . Subdural fluid collection 2017   Fall/coumadin/Etoh abuse  . Vitamin B 12 deficiency    supplemented by B 12 injections     Objective: Vital signs in last 24 hours: Temp:  [97.8 F (36.6 C)-98.2 F (36.8 C)] 97.8 F (36.6 C) (07/14 0500) Pulse Rate:  [86-90] 86 (07/14 0500) Resp:  [16] 16 (07/14 0500) BP: (117-127)/(78) 127/78 (07/14 0500) SpO2:  [96 %-98 %] 96 % (07/14 0500) Weight change:  Last BM Date: 03/11/17  Intake/Output from previous day: 07/13 0701 - 07/14 0700 In: 400 [P.O.:400] Out: 150 [Urine:150] Last cbgs: CBG (last 3)  No results for input(s): GLUCAP in the last 72 hours.  BP Readings from Last 3 Encounters:  03/12/17 127/78    Physical Exam General: No apparent distress  But weak HEENT: not dry; C Collar in place Lungs: Normal effort. Lungs clear to auscultation, no crackles or wheezes. Cardiovascular: Regular rate and rhythm, no edema; Gr 3/6 systolic and Gr 3/6 diastolic murmurs Abdomen: S/NT/ND; BS(+) Musculoskeletal:  unchanged Neurological: No new neurological deficits  Skin:  clear  Stasis changes Mental state: Alert, oriented, cooperative    Lab Results: BMET    Component Value Date/Time   NA 136 03/09/2017 0606   K 3.8 03/09/2017 0606   CL 103 03/09/2017 0606   CO2 26 03/09/2017 0606   GLUCOSE 79 03/09/2017 0606   BUN 17 03/09/2017 0606   CREATININE 0.73 03/09/2017 0606   CALCIUM 9.1 03/09/2017 0606   GFRNONAA >60 03/09/2017 0606   GFRAA >60 03/09/2017 0606   CBC    Component Value Date/Time   WBC 3.2 (L) 03/09/2017 0606   RBC 3.43 (L) 03/09/2017 0606   HGB 10.1 (L) 03/09/2017 0606   HCT 32.8 (L) 03/09/2017 0606   PLT 174 03/09/2017 0606   MCV 95.6 03/09/2017 0606   MCH 29.4 03/09/2017 0606   MCHC 30.8 03/09/2017 0606   RDW 16.4 (H) 03/09/2017 0606   LYMPHSABS 1.2 03/09/2017 0606   MONOABS 0.2 03/09/2017 0606   EOSABS 0.4 03/09/2017 0606   BASOSABS 0.0 03/09/2017 0606     Medications: I have reviewed the patient's current medications.  Assessment/Plan:   Functional and mobility deficitssecondary to polytrauma/debility after multiple medical           Cont CIR           C-collar for 12 weeks 2. LLE/LUE DVT /Anticoagulation: Pharmaceutical: Other (comment)Eliquis  Skin/Wound Care: Monitor stoma for healing. Maintain adequate nutritional and hydration status.   Fluids/Electrolytes/Nutrition: Monitor I/Os  BMP within acceptable range on 7/11           Megace started 7/11, slightly improved           Labs ordered for Monday Hypotension: Monitor BP bid--continue midodrine   Stable 7/14.  A fib: In NSR on amiodarone.  ABLA: Resolving. Will continue to monitor.            Hb 10.1 on 7/11           Cont to monitor           Labs ordered for Monday   Length of stay, days: [place] , MD 03/12/2017, 9:44 AM

## 2017-03-13 ENCOUNTER — Inpatient Hospital Stay (HOSPITAL_COMMUNITY): Payer: Medicare Other | Admitting: Physical Therapy

## 2017-03-13 ENCOUNTER — Inpatient Hospital Stay (HOSPITAL_COMMUNITY): Payer: Medicare Other

## 2017-03-13 ENCOUNTER — Encounter (HOSPITAL_COMMUNITY): Payer: Self-pay

## 2017-03-13 NOTE — Progress Notes (Signed)
Patient ID: Alec Rivers, male   DOB: February 09, 1951, 66 y.o.   MRN: [id number]   03/13/17.  Alec Rivers is a 66 y.o. male  Who is admitted for CIR with functional and mobility deficits secondary to polytrauma.  Subjective: No new complaints. No new problems. Slept well. Feeling OK but remains weak   Objective: Vital signs in last 24 hours: Temp:  [97.7 F (36.5 C)-98.3 F (36.8 C)] 97.7 F (36.5 C) (07/15 0500) Pulse Rate:  [82-91] 82 (07/15 0500) Resp:  [16] 16 (07/15 0500) BP: (121-127)/(80-83) 127/80 (07/15 0500) SpO2:  [95 %-97 %] 95 % (07/15 0500) Weight change:  Last BM Date: 03/11/17  Intake/Output from previous day: 07/14 0701 - 07/15 0700 In: 600 [P.O.:600] Out: 500 [Urine:500] Last cbgs: CBG (last 3)  No results for input(s): GLUCAP in the last 72 hours.   Past Medical History:  Diagnosis Date  . Abdominal aortic aneurysm (AAA), 30-34 mm diameter (HCC)   . Alcohol abuse   . Alcohol withdrawal seizure with complication ([facility])   . Aortic root dilation (HCC)   . CAD (coronary artery disease)   . DVT, bilateral lower limbs ([place]) 03/2016  . Esophageal adenocarcinoma ([facility])    treated with chemoradiation  . GERD (gastroesophageal reflux disease)   . Gout   . HTN (hypertension)   . PAF (paroxysmal atrial fibrillation) ([facility])    s/p ablation 2013  . Spinal stenosis, lumbar region with neurogenic claudication   . Subdural fluid collection 2017   Fall/coumadin/Etoh abuse  . Vitamin B 12 deficiency    supplemented by B 12 injections      Physical Exam General: No apparent distress   HEENT: not dry; cervical collar in place  Lungs: Normal effort. Lungs clear to auscultation, no crackles or wheezes. Cardiovascular: Regular rate and rhythm, no edema; grade 3/6 systolic and diastolic murmurs noted  Abdomen: S/NT/ND; BS(+) Musculoskeletal:  unchanged Neurological: No new neurological deficits.  Generally weak  Wounds: N/A    Skin: clear  Aging  changes Mental state: Alert, oriented, cooperative  BP Readings from Last 3 Encounters:  03/13/17 127/80    Lab Results: BMET    Component Value Date/Time   NA 136 03/09/2017 0606   K 3.8 03/09/2017 0606   CL 103 03/09/2017 0606   CO2 26 03/09/2017 0606   GLUCOSE 79 03/09/2017 0606   BUN 17 03/09/2017 0606   CREATININE 0.73 03/09/2017 0606   CALCIUM 9.1 03/09/2017 0606   GFRNONAA >60 03/09/2017 0606   GFRAA >60 03/09/2017 0606   CBC    Component Value Date/Time   WBC 3.2 (L) 03/09/2017 0606   RBC 3.43 (L) 03/09/2017 0606   HGB 10.1 (L) 03/09/2017 0606   HCT 32.8 (L) 03/09/2017 0606   PLT 174 03/09/2017 0606   MCV 95.6 03/09/2017 0606   MCH 29.4 03/09/2017 0606   MCHC 30.8 03/09/2017 0606   RDW 16.4 (H) 03/09/2017 0606   LYMPHSABS 1.2 03/09/2017 0606   MONOABS 0.2 03/09/2017 0606   EOSABS 0.4 03/09/2017 0606   BASOSABS 0.0 03/09/2017 0606    Medications: I have reviewed the patient's current medications.  Assessment/Plan:  Functional and mobility deficits secondary to polytrauma.  Continue CIR History of hypotension.  Blood pressure stable today.  Continue Midodrine Paroxysmal atrial fibrillation.  Rhythm regular presently.  Continue Eliquis ABLA: Resolving. Will continue to monitor.  Hb 10.1 on 7/11 Cont to monitor Labs ordered for Monday    Length of stay, days: [place] ,  MD 03/13/2017, 9:23 AM

## 2017-03-13 NOTE — Progress Notes (Signed)
Occupational Therapy Session Note  Patient Details  Name: Alec Rivers MRN: [id number] Date of Birth: May 31, 1951  Today's Date: 03/13/2017 OT Individual Time: 0258-5277 OT Individual Time Calculation (min): 75 min    Short Term Goals: Week 1:  OT Short Term Goal 1 (Week 1): Pt will complete all bathing sit to stand with supervision. OT Short Term Goal 2 (Week 1): Pt will complete LB dressing sit to stand with supervision. OT Short Term Goal 3 (Week 1): Pt will complete toilet transfers with supervision using the RW for support. OT Short Term Goal 4 (Week 1): Pt will complete shower/tub transfers with min assist using RW and tub bench.   OT Short Term Goal 5 (Week 1): Pt will complete LUE FM/strengthening exercises with supervision.    Skilled Therapeutic Interventions/Progress Updates:    1:1. Focus of session on BUE strengthening, functional endurance, fucntional transfers, toileting and FMC of LUE. t declines opportunity to ambuate to/from tx destinations 2/2 fatigue. Pt propels w/c with supervision and rest breaks to/from all tx destinations to improve BUE strength and endurance required for BADLs. While in outside courtyard to play board game. OT cued pt for forced use of LUE to manipulate pieces/record score to imrprove FMC of LUE. Pt reports needing to void bowel and bladder. When returned to room pt completes toileting with touching A for clothing management and supervision for hygiene. Pt ambulates with RW w/c>toilet>EOB with touching A and VC for RW management and safety awareness. OT sets up pt EOB with dinner tray, call light in reach, bed exit alarm on and all needs met.    Therapy Documentation Precautions:  Precautions Precautions: Fall Required Braces or Orthoses: Cervical Brace Cervical Brace: Hard collar, At all times Restrictions Weight Bearing Restrictions: No LUE Weight Bearing: Non weight bearing See Function Navigator for Current Functional  Status.   Therapy/Group: Individual Therapy  Tonny Branch 03/13/2017, 6:02 PM

## 2017-03-13 NOTE — Progress Notes (Signed)
Occupational Therapy Note  Patient Details  Name: Alec Rivers MRN: [id number] Date of Birth: 1951-03-05  Today's Date: 03/13/2017 OT Individual Time: 1000-1100 OT Individual Time Calculation (min): 60 min   Pt denies pain Individual Therapy  Pt resting in bed upon arrival and agreeable to therapy.  Pt stated he already had his "work out" clothes on and declined bathing and changing clothing.  Pt amb with RW with supervision to ADL apartment and practiced stepping over in/out of tub with steady A and using grab bars.  Pt agreed that tub seat would be beneficial after discharge.  Pt amb with with RW to gym and engaged in [place] (work load 5, 7 mins X2, BLE only).  Pt transitioned to sit<>stand without BUE use.  Pt requires min A for initiating sit<>stand.  Pt amb with RW back to room and returned to recliner with all needs within reach.  Focus on activity tolerance, functional amb with RW, sit<>stand, standing balance, and safety awareness to increase independence with BADLs.   Leotis Shames [facility] 03/13/2017, 11:40 AM

## 2017-03-13 NOTE — Progress Notes (Signed)
Physical Therapy Session Note  Patient Details  Name: Alec Rivers MRN: [id number] Date of Birth: 1951/08/02  Today's Date: 03/13/2017 PT Individual Time: 9244-6286 PT Individual Time Calculation (min): 49 min   Short Term Goals: Week 1:  PT Short Term Goal 1 (Week 1): STG=LTG due to short length of stay   Skilled Therapeutic Interventions/Progress Updates:    no c/o pain but reports fatigue from AM OT session.  Session focus on activity tolerance with functional mobility.    Pt transitions sit<>stand throughout session with and without RW with supervision.  Gait to and from therapy gym with supervision and RW.  Pt completed Wii [employer] with alternating turns sitting and standing focus on transfers and activity tolerance.  Pt reports LEs are fatigued following bowling and requesting to return to bed.  Ambulation back to room as above, positioned in bed with call bell in reach and needs met.   Therapy Documentation Precautions:  Precautions Precautions: Fall Required Braces or Orthoses: Cervical Brace Cervical Brace: Hard collar, At all times Restrictions Weight Bearing Restrictions: No LUE Weight Bearing: Non weight bearing General: PT Amount of Missed Time (min): 11 Minutes PT Missed Treatment Reason: Patient fatigue   See Function Navigator for Current Functional Status.   Therapy/Group: Individual Therapy  Earnest Conroy [place]Crew 03/13/2017, 2:55 PM

## 2017-03-14 ENCOUNTER — Inpatient Hospital Stay (HOSPITAL_COMMUNITY): Payer: Medicare Other | Admitting: Occupational Therapy

## 2017-03-14 ENCOUNTER — Inpatient Hospital Stay (HOSPITAL_COMMUNITY): Payer: Medicare Other

## 2017-03-14 LAB — CBC WITH DIFFERENTIAL/PLATELET
BASOS ABS: 0 10*3/uL (ref 0.0–0.1)
Basophils Relative: 0 %
EOS PCT: 9 %
Eosinophils Absolute: 0.5 10*3/uL (ref 0.0–0.7)
HEMATOCRIT: 32.8 % — AB (ref 39.0–52.0)
Hemoglobin: 10 g/dL — ABNORMAL LOW (ref 13.0–17.0)
LYMPHS ABS: 2.1 10*3/uL (ref 0.7–4.0)
LYMPHS PCT: 39 %
MCH: 28.7 pg (ref 26.0–34.0)
MCHC: 30.5 g/dL (ref 30.0–36.0)
MCV: 94 fL (ref 78.0–100.0)
MONO ABS: 0.5 10*3/uL (ref 0.1–1.0)
MONOS PCT: 9 %
NEUTROS ABS: 2.3 10*3/uL (ref 1.7–7.7)
Neutrophils Relative %: 43 %
PLATELETS: 152 10*3/uL (ref 150–400)
RBC: 3.49 MIL/uL — ABNORMAL LOW (ref 4.22–5.81)
RDW: 16.7 % — ABNORMAL HIGH (ref 11.5–15.5)
WBC: 5.4 10*3/uL (ref 4.0–10.5)

## 2017-03-14 LAB — BASIC METABOLIC PANEL
ANION GAP: 8 (ref 5–15)
BUN: 31 mg/dL — AB (ref 6–20)
CO2: 25 mmol/L (ref 22–32)
Calcium: 9.3 mg/dL (ref 8.9–10.3)
Chloride: 104 mmol/L (ref 101–111)
Creatinine, Ser: 0.92 mg/dL (ref 0.61–1.24)
GFR calc Af Amer: >60 mL/min (ref >60)
GLUCOSE: 88 mg/dL (ref 65–99)
Potassium: 3.8 mmol/L (ref 3.5–5.1)
Sodium: 137 mmol/L (ref 135–145)

## 2017-03-14 NOTE — Progress Notes (Signed)
7/16Algis Liming PAC aware of pts increased nausea; no emesis. Compazine effective, orders received. Appetite remains very poor.

## 2017-03-14 NOTE — Progress Notes (Signed)
Occupational Therapy Note  Patient Details  Name: Alec Rivers MRN: [id number] Date of Birth: 1951-01-23  Pt missed 90 mins OT secondary to being nauseated and now sleeping.  Per nursing they requested to let him sleep at this time.   Brandley Aldrete OTR/L 03/14/2017, 12:59 PM

## 2017-03-14 NOTE — Progress Notes (Signed)
Physical Therapy Session Note  Patient Details  Name: Alec Rivers MRN: [id number] Date of Birth: Mar 22, 1951  Today's Date: 03/14/2017 PT Individual Time: 1000-1100 PT Individual Time Calculation (min): 60 min   Short Term Goals: Week 1:  PT Short Term Goal 1 (Week 1): STG=LTG due to short length of stay   Skilled Therapeutic Interventions/Progress Updates:    Pt supine in bed upon PT arrival, agreeable to therapy tx. Pt transferred from supine to sitting EOB with supervision. Sitting EOB pt donned shoes with supervision and increased time to complete. Pt stood with supervision to doff dirty shorts and don new briefs. Pt ambulated x 150 ft with RW and supervision. Pt worked on standing balance on biodex using limits of stability training and catch game without UE support and verbal cues and facilitation for weightshifts. Pt ambulated x 40 ft without AD, pt with good balance at first but then drifted towards doorway and reported feeling dizzy. Vitals monitored, BP 144/91 after 5 minutes of sitting and then BP 118/86 with report of dizziness after standing with RW. PT discussed findings with RN for possible ted hoes order. Pt propelled w/c back to room using UEs and increased time, supervision. Pt reported "feeling very down today" and also very tired and sore. Pt transferred from w/c to recliner using RW and stand step transfer with supervision. Pt left in recliner with call bell in reach.  Pain: pt denies pain this session.   Therapy Documentation Precautions:  Precautions Precautions: Fall Required Braces or Orthoses: Cervical Brace Cervical Brace: Hard collar, At all times Restrictions Weight Bearing Restrictions: No LUE Weight Bearing: Non weight bearing   See Function Navigator for Current Functional Status.   Therapy/Group: Individual Therapy  Netta Corrigan, PT, DPT 03/14/2017, 7:59 AM

## 2017-03-14 NOTE — Progress Notes (Signed)
[place] PHYSICAL MEDICINE & REHABILITATION     PROGRESS NOTE  Subjective/Complaints:  Pt asking about D/C date Slept well no c/os  ROS: Denies CP, SOB, N/V/D.  Objective: Vital Signs: Blood pressure 109/74, pulse 82, temperature 98.2 F (36.8 C), temperature source Oral, resp. rate 16, height 6' (1.829 m), weight 77 kg (169 lb 12.1 oz), SpO2 97 %. No results found.  Recent Labs  03/14/17 0437  WBC 5.4  HGB 10.0*  HCT 32.8*  PLT 152    Recent Labs  03/14/17 0727  NA 137  K 3.8  CL 104  GLUCOSE 88  BUN 31*  CREATININE 0.92  CALCIUM 9.3   CBG (last 3)  No results for input(s): GLUCAP in the last 72 hours.  Wt Readings from Last 3 Encounters:  03/09/17 77 kg (169 lb 12.1 oz)  03/08/17 78.9 kg (174 lb)    Physical Exam:  BP 109/74 (BP Location: Right Arm)   Pulse 82   Temp 98.2 F (36.8 C) (Oral)   Resp 16   Ht 6' (1.829 m) Comment: 6 ft  Wt 77 kg (169 lb 12.1 oz)   SpO2 97%   BMI 23.02 kg/m  Constitutional: He appears well-developedand well-nourished. NAD. HENT: Normocephalicand atraumatic.  Eyes: EOMare normal. No discharge.  Neck: +Cervical collar in place Cardiovascular: RRR. +Murmurheard. No JVD. Respiratory: Effort normal breath sounds normal.  GI: Soft. Bowel sounds are normal.  Musculoskeletal: He exhibits no edemaor tenderness.  Neurological: He is alertand oriented.  Mild hearing loss.  Motor: RUE 4/5 prox to distal.  LUE; 4/5 proximal to distal (weaker than right with pain inhibition, stable) Bilateral LE: 3+/5 HF, KE and 4-/5 ADF/PF (stable).  Skin: Skin is warmand dry. BLE with stasis changes. Psychiatric: He has a normal mood and affect. His behavior is normal.   Assessment/Plan: 1. Functional deficits secondary to debility which require 3+ hours per day of interdisciplinary therapy in a comprehensive inpatient rehab setting. Physiatrist is providing close team supervision and 24 hour management of active medical problems  listed below. Physiatrist and rehab team continue to assess barriers to discharge/monitor patient progress toward functional and medical goals.  Function:  Bathing Bathing position   Position: Shower  Bathing parts Body parts bathed by patient: Right arm, Left arm, Chest, Abdomen, Front perineal area, Buttocks, Right upper leg, Left upper leg, Right lower leg, Left lower leg Body parts bathed by helper: Back  Bathing assist Assist Level: Touching or steadying assistance(Pt > 75%)      Upper Body Dressing/Undressing Upper body dressing   What is the patient wearing?: Pull over shirt/dress     Pull over shirt/dress - Perfomed by patient: Thread/unthread right sleeve, Thread/unthread left sleeve, Put head through opening, Pull shirt over trunk          Upper body assist        Lower Body Dressing/Undressing Lower body dressing   What is the patient wearing?: Pants, Socks, Shoes     Pants- Performed by patient: Thread/unthread right pants leg, Thread/unthread left pants leg, Pull pants up/down, Fasten/unfasten pants   Non-skid slipper socks- Performed by patient: Don/doff right sock, Don/doff left sock   Socks - Performed by patient: Don/doff right sock, Don/doff left sock   Shoes - Performed by patient: Don/doff right shoe, Don/doff left shoe, Fasten right, Fasten left            Lower body assist Assist for lower body dressing: Touching or steadying assistance (Pt > 75%)  Toileting Toileting   Toileting steps completed by patient: Adjust clothing prior to toileting, Performs perineal hygiene, Adjust clothing after toileting Toileting steps completed by helper: Adjust clothing after toileting Toileting Assistive Devices: Grab bar or rail  Toileting assist Assist level: Touching or steadying assistance (Pt.75%)   Transfers Chair/bed transfer   Chair/bed transfer method: Ambulatory Chair/bed transfer assist level: Supervision or verbal cues Chair/bed transfer  assistive device: Armrests, Medical sales representative     Max distance: 150 Assist level: Supervision or verbal cues   Wheelchair   Type: Manual Max wheelchair distance: 75 Assist Level: Supervision or verbal cues  Cognition Comprehension Comprehension assist level: Follows basic conversation/direction with no assist  Expression Expression assist level: Expresses complex 90% of the time/cues < 10% of the time  Social Interaction Social Interaction assist level: Interacts appropriately 90% of the time - Needs monitoring or encouragement for participation or interaction.  Problem Solving Problem solving assist level: Solves basic 90% of the time/requires cueing < 10% of the time  Memory Memory assist level: Recognizes or recalls 90% of the time/requires cueing < 10% of the time    Medical Problem List and Plan: 1. Functional and mobility deficitssecondary to polytrauma/debility after multiple medical  Cont CIR- team conf in am  C-collar for 12 weeks (~8/25), can take off with therapies only if supported  WBAT LUE 2. LLE/LUE DVT /Anticoagulation: Pharmaceutical: Other (comment)Eliquis 3. Pain Management: tylenol prn 4. Mood: LCSW to follow for evaluation and support 5. Neuropsych: This patient is not fullycapable of making decisions on hisown behalf. 6. Skin/Wound Care: Monitor stoma for healing. Maintain adequate nutritional and hydration status.  7. Fluids/Electrolytes/Nutrition: Monitor I/Os  BMP ok today except elevated BUN will enc fluid  Megace started 7/11, slightly improved  8. Hypotension: Monitor BP bid--continue midodrine  Controlled 7/13 9. A fib: In NSR on amiodarone. 10. Anxiety/depression: Managed by Paxil, Seroquel at bedtime and klonopin bid  11. H/o gout with flare ups: On allopurinol and Colcyrys  12. ABLA: Resolving. Will continue to monitor.   Hb 10.1 on 7/11, 10.0 on 7/16 stable  Cont to monitor  Labs ordered for Monday 13. Leukopenia:  Question medication related.   WBCs 3.2 on 7/11  5.4 today-resolved  Cont to monitor   LOS (Days) 6 A FACE TO FACE EVALUATION WAS PERFORMED  Charlett Blake 03/14/2017 8:41 AM

## 2017-03-14 NOTE — Progress Notes (Signed)
Occupational Therapy Session Note  Patient Details  Name: Alec Rivers MRN: [id number] Date of Birth: 11-02-1950  Today's Date: 03/14/2017 OT Individual Time: 1500-1545 OT Individual Time Calculation (min): 45 min    Short Term Goals: Week 2:     Skilled Therapeutic Interventions/Progress Updates:    Treatment session focused on ADLs/self care training, transfer training, safety awareness during functional mobility, and pt education.  Pt reported to not feel very well today and it was noted that he did not eat his lunch due to nausea. Pt agreed to practice tub transfers and kitchen access and functional mobility. Pt transferred from supine lying to EOB with S and from EOB to w/c with walker with S. Pt was brought down to shower room to practice tub transfers.Therapist demonstrated and discussed tub transfer with tub bench prior to pt demoing task. Pt transferred from w/c to bench with close guard assist and therapist provided verbal cues for hand and foot placement. Pt used B Ue as tolerated to lift scoot himself in tub and lifted legs. Pt completed sequence for getting out of tub as noted above with the same level of assistance. In therapy kitchen pt was provided step by step instructions to heat up lunch in the micro wave. He put the dish in the micro wave and therapist assisted with heat/time setting. Pt walked to w/c with walker and CGA and his food was brought to him from counter. He wasn't very hungry but agreed to eat some of his lunch, requiring assistance with opening his soda. After several minutes of eating, pt stated he was finished. He was returned to his room to rest via w/c. Pt transferred from w/c <>bed<>supine with S and left with needs met with call bell in reach.   Therapy Documentation Precautions:  Precautions Precautions: Fall Required Braces or Orthoses: Cervical Brace Cervical Brace: Hard collar, At all times Restrictions Weight Bearing Restrictions: No LUE  Weight Bearing: Non weight bearing General: General OT Amount of Missed Time: 90 Minutes Vital Signs: Therapy Vitals Temp: 98.2 F (36.8 C) Temp Source: Oral Pulse Rate: 90 Resp: 20 BP: 111/78 Patient Position (if appropriate): Lying Oxygen Therapy SpO2: 96 % O2 Device: Not Delivered Pain: Pain Assessment Pain Assessment: No/denies pain  See Function Navigator for Current Functional Status.   Therapy/Group: Individual Therapy  Delon [place] 03/14/2017, 4:08 PM

## 2017-03-15 ENCOUNTER — Inpatient Hospital Stay (HOSPITAL_COMMUNITY): Payer: Medicare Other | Admitting: Physical Therapy

## 2017-03-15 ENCOUNTER — Encounter (HOSPITAL_COMMUNITY): Payer: Medicare Other | Admitting: Psychology

## 2017-03-15 ENCOUNTER — Inpatient Hospital Stay (HOSPITAL_COMMUNITY): Payer: Medicare Other | Admitting: Occupational Therapy

## 2017-03-15 LAB — BASIC METABOLIC PANEL
Anion gap: 8 (ref 5–15)
BUN: 32 mg/dL — AB (ref 6–20)
CALCIUM: 9.4 mg/dL (ref 8.9–10.3)
CO2: 25 mmol/L (ref 22–32)
CREATININE: 0.93 mg/dL (ref 0.61–1.24)
Chloride: 104 mmol/L (ref 101–111)
GFR calc Af Amer: >60 mL/min (ref >60)
GLUCOSE: 85 mg/dL (ref 65–99)
Potassium: 3.9 mmol/L (ref 3.5–5.1)
Sodium: 137 mmol/L (ref 135–145)
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
## 2017-03-15 NOTE — Progress Notes (Signed)
Occupational Therapy Session Note  Patient Details  Name: Alec Rivers MRN: [id number] Date of Birth: Jun 06, 1951  Today's Date: 03/15/2017 OT Individual Time: 0947-0962 OT Individual Time Calculation (min): 75 min    Short Term Goals: Week 1:  OT Short Term Goal 1 (Week 1): Pt will complete all bathing sit to stand with supervision. OT Short Term Goal 2 (Week 1): Pt will complete LB dressing sit to stand with supervision. OT Short Term Goal 3 (Week 1): Pt will complete toilet transfers with supervision using the RW for support. OT Short Term Goal 4 (Week 1): Pt will complete shower/tub transfers with min assist using RW and tub bench.   OT Short Term Goal 5 (Week 1): Pt will complete LUE FM/strengthening exercises with supervision.    Skilled Therapeutic Interventions/Progress Updates:   Pt completed bathing, dressing, and grooming during session.  He was able to ambulate to the shower and around the room with min guard assist using the RW.  Mod instructional cueing to stay inside the walker and bring it back to the surface he is going to sit down on.  He also needed cueing to reach back and sit down with control once the surface was reached.  He completed all bathing sit to stand with supervision except for washing his back, which therapist assisted with.  Maintained cervical collar through shower and then therapist changed out pads once pt was through, with him sitting on the shower seat and keeping his head still.  Pt has had collar for 7 weeks and reports that he was told he only had to wear it 6-7 weeks.  Will follow up with MD on if it's possible to discharge it.  He completed dressing with supervision sit to stand at the EOB.  He then stood at the sink to complete oral hygiene with supervision, using the RW for support.  Pt transferred to the wheelchair at end of session with call button and phone in reach.    Therapy Documentation Precautions:  Precautions Precautions:  Fall Required Braces or Orthoses: Cervical Brace Cervical Brace: Hard collar, At all times Restrictions Weight Bearing Restrictions: Yes LUE Weight Bearing: Weight bearing as tolerated  Pain: Pain Assessment Pain Assessment: No/denies pain ADL: See Function Navigator for Current Functional Status.   Therapy/Group: Individual Therapy  Donnalynn Wheeless OTR/L 03/15/2017, 12:43 PM

## 2017-03-15 NOTE — Progress Notes (Signed)
Physical Therapy Session Note  Patient Details  Name: Alec Rivers MRN: [id number] Date of Birth: [birth year]  Today's Date: 03/15/2017 PT Individual Time: 0800-0901 AND 1501-1531 PT Individual Time Calculation (min): 61 min AND 30 min   Short Term Goals: Week 1:  PT Short Term Goal 1 (Week 1): STG=LTG due to short length of stay   Skilled Therapeutic Interventions/Progress Updates:   Session 1:  Pt in bed upon arrival (HOB > 30 deg) and agreeable to therapy. Pt transferred to EOB w/ supervision and elevated HOB/siderail assist. Pt sat EOB ~5 min performing dynamic sitting tasks w/ supervision and minimal signs of increased fatigue.   Pt ambulated EOB to/from toilet w/ RW and supervision and maintained static sitting balance on toilet w/ supervision. Assisted pt w/ pericare and toilet hygiene (Total A), PT documented urine/stool output in flowsheets.   Pt ambulated to/from therapy gym w/ supervision and RW (~200 ft each way) and participated in dynamic standing balance tasks:  -Airex pad w/ close supervision and no UE support, 30 sec x3   -UE reaching tasks outside of BOS on firm surface w/ close supervision and no UE support, 2-3 min x3   Pt required mulitple seated rest breaks secondary to fatigue and weakness in legs   Pt ended session in bed (HOB > 30 deg), bed alarm on, call bell within reach and all needs met.   Session 2:  Pt in bed upon arrival and agreeable to therapy. Pt transferred EOB w/ supervision and ambulated to/from therapy gym w/ supervision (~200 ft each way).   Performed static and dynamic balance activities w/ close supervision. Pt scored 37/56 on the BERG as detailed below.   Pt required mulitple seated rest breaks secondary to fatigue and weakness in legs   Pt ended session in bed (HOB > 30 deg), bed alarm on, call bell within reach and all needs met.   Therapy Documentation Precautions:  Precautions Precautions: Fall Required Braces or Orthoses:  Cervical Brace Cervical Brace: Hard collar, At all times Restrictions Weight Bearing Restrictions: No LUE Weight Bearing: Non weight bearing Vital Signs: Therapy Vitals Temp: 97.8 F (36.6 C) Temp Source: Oral Pulse Rate: 81 Resp: 18 BP: 107/72 Patient Position (if appropriate): Lying Oxygen Therapy SpO2: 97 % O2 Device: Not Delivered Balance:  BERG Balance Scale: 37/56   See Function Navigator for Current Functional Status.   Therapy/Group: Individual Therapy  [name] 03/15/2017, 3:48 PM
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
## 2017-03-15 NOTE — Progress Notes (Signed)
Occupational Therapy Note  Patient Details  Name: Alec Rivers MRN: [id number] Date of Birth: 06/09/1951  Today's Date: 03/15/2017 OT Individual Time: 1137-1202 OT Individual Time Calculation (min): 25 min   No c/o pain.  Pt seen this session to improve functional mobility, balance and LUE AROM. Pt received in w/c and agreeable to tx. Pt taken to gym via w/c.  Pt engaged in L shoulder AROM with rotating a large hula hoop and wrist ext strength by grasping and lifting hoop with wrist. Sit to stands 10x with close S. Static stand focusing on engaging abdominals and extending trunk towards neutral.  pt sat back in w/c. Rolled towel placed at lower back for lumbar support. Pt stated this was helpful. Returned to room and set up with meal tray. Pt with all needs met.    SAGUIER,JULIA 03/15/2017, 12:48 PM   

## 2017-03-16 ENCOUNTER — Inpatient Hospital Stay (HOSPITAL_COMMUNITY): Payer: Medicare Other | Admitting: Occupational Therapy

## 2017-03-16 ENCOUNTER — Inpatient Hospital Stay (HOSPITAL_COMMUNITY): Payer: Medicare Other | Admitting: *Deleted

## 2017-03-16 ENCOUNTER — Inpatient Hospital Stay (HOSPITAL_COMMUNITY): Payer: Medicare Other | Admitting: Physical Therapy

## 2017-03-16 ENCOUNTER — Inpatient Hospital Stay (HOSPITAL_COMMUNITY): Payer: Medicare Other

## 2017-03-16 MED ORDER — PRO-STAT SUGAR FREE PO LIQD
30.0000 mL | Freq: Every day | ORAL | Status: DC
  Administered 2017-03-17 – 2017-03-18 (×2): 30 mL via ORAL
  Filled 2017-03-16 (×2): qty 30

## 2017-03-16 NOTE — Plan of Care (Signed)
Problem: RH Balance Goal: LTG Patient will maintain dynamic standing balance (PT) LTG:  Patient will maintain dynamic standing balance with assistance during mobility activities (PT)  Upgraded 7/18  due to progress  Problem: RH Bed to Chair Transfers Goal: LTG Patient will perform bed/chair transfers w/assist (PT) LTG: Patient will perform bed/chair transfers with assistance, with/without cues (PT).  upgraded 7/18 due to progress  Problem: RH Ambulation Goal: LTG Patient will ambulate in home environment (PT) LTG: Patient will ambulate in home environment, # of feet with assistance (PT).  upgraded 7/18 due to progress
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
## 2017-03-16 NOTE — Progress Notes (Signed)
Physical Therapy Session Note  Patient Details  Name: Alec Rivers MRN: [id number] Date of Birth: [birth year]  Today's Date: 03/16/2017 PT Individual Time: 1630-1700 PT Individual Time Calculation (min): 30 min   Short Term Goals: Week 1:  PT Short Term Goal 1 (Week 1): STG=LTG due to short length of stay   Skilled Therapeutic Interventions/Progress Updates:  Pt received supine in bed and agreeable to PT. Supine>sit transfer with supervision assist and min safety cues.   Gait training to and from room with RW 2 x 176f. Dynamic gait training with RW to weave through 6 cones and step over 2, 1inch obstacles. PT provided supervision assist throughout gait training with min cues for AD management with obstacle negotiation.   Nustep endurance training x 10 mintues min, level 2 >5  cues for increased steps per minute.   Pt returned to room and performed ambulatory transfer to bed with distant supervision assist . Sit>supine completed with  Supervision assist. Pt left supine in bed with call bell in reach and all needs met.        Therapy Documentation Precautions:  Precautions Precautions: Fall Required Braces or Orthoses: Cervical Brace Cervical Brace: Hard collar, At all times Restrictions Weight Bearing Restrictions: No LUE Weight Bearing: Weight bearing as tolerated Vital Signs: Therapy Vitals Temp: (!) 97.3 F (36.3 C) Temp Source: Oral Pulse Rate: 85 Resp: 18 BP: 132/84 Patient Position (if appropriate): Lying Oxygen Therapy SpO2: 97 % O2 Device: Not Delivered Pain: 0/10 See Function Navigator for Current Functional Status.   Therapy/Group: Individual Therapy  ALorie Phenix7/18/2018, 5:04 PM
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
## 2017-03-16 NOTE — Progress Notes (Addendum)
Physical Therapy Session Note  Patient Details  Name: Alec Rivers MRN: [id number] Date of Birth: Apr 02, 1951  Today's Date: 03/16/2017 PT Individual Time: 0800-0900, 8185-6314 PT Individual Time Calculation (min): 60 min, 30 min   Short Term Goals: Week 1:  PT Short Term Goal 1 (Week 1): STG=LTG due to short length of stay   Skilled Therapeutic Interventions/Progress Updates:  Session 1: 0800-0900   Pt supine in bed upon PT arrival, agreeable to therapy tx. Pt transferred supine to sitting EOB with supervision. Pt transferred sit>stand with RW and supervision, ambulated x 185ft to the gym with RW and supervision. Pt working on endurance and ROM on nustep x 5 minutes, workload 5. Pt ascended/descended 4 steps using a single handrail, step to pattern with min assist for balance. Pt performed steps ups with B UE support x 5 each leg to work on LE strengthening.   Standing LE strengthening exercises using RW: 2 x 10 mini squats, 2 x 10 hip abduction, 2 x 10 hip extension, 2 x 10 hamstring curls Seated LE strengthening: 2 x 10 LAQ Working on balance, pt ambulated 2 x 76ft tandem walking using RW and supervision.  Provided pt with exercise handout.  Pt ambulated 150 ft back to room with supervision and RW.  Pt left in recliner with call bell in reach. Pt denies pain this session.   Session 2: 1435-1505 Pt ambulated x 80 ft to rehab apartment using RW and supervision. Working on standing balance pt unmade/made the bed without using UE support. Pt worked on bed mobility getting in and out of bed with supervision, in order to work on getting up without the [facility] elevated. Working on dynamic standing balance and following commands in order to open cabinets in kitchen, open fridge and open pantry to gather items while using single UE support with supervision. Pt ambulated 250 ft using RW and supervision to work on endurance down an extra hall and then back to his room. Pt left supine in bed with call  bell in reach. Pt denies pain this session.   Therapy Documentation Precautions:  Precautions Precautions: Fall Required Braces or Orthoses: Cervical Brace Cervical Brace: Hard collar, At all times Restrictions Weight Bearing Restrictions: Yes LUE Weight Bearing: Weight bearing as tolerated   See Function Navigator for Current Functional Status.   Therapy/Group: Individual Therapy  Netta Corrigan, PT, DPT 03/16/2017, 8:02 AM

## 2017-03-16 NOTE — Progress Notes (Signed)
Occupational Therapy Session Note  Patient Details  Name: Alec Rivers MRN: [id number] Date of Birth: 1951-01-25  Today's Date: 03/16/2017 OT Individual Time: 1101-1200 OT Individual Time Calculation (min): 59 min    Short Term Goals: Week 1:  OT Short Term Goal 1 (Week 1): Pt will complete all bathing sit to stand with supervision. OT Short Term Goal 2 (Week 1): Pt will complete LB dressing sit to stand with supervision. OT Short Term Goal 3 (Week 1): Pt will complete toilet transfers with supervision using the RW for support. OT Short Term Goal 4 (Week 1): Pt will complete shower/tub transfers with min assist using RW and tub bench.   OT Short Term Goal 5 (Week 1): Pt will complete LUE FM/strengthening exercises with supervision.    Skilled Therapeutic Interventions/Progress Updates:    Pt completed bathing and dressing during session.  Supervision for ambulation to the toilet prior to shower.  He was able to complete all aspects of toileting with supervision but needed mod instructional cueing for ambulating closer to the toilet before turning around with the walker instead of backing up a few feet.  He was able to complete all aspects of showering with supervision sit to stand.  Returned to bed for therapist to remove collar and replace wet pads, with pt supported in supine.  Finished session with dressing sitting EOB with setup.  Pt transferred to the wheelchair for lunch with supervision as well.    Therapy Documentation Precautions:  Precautions Precautions: Fall Required Braces or Orthoses: Cervical Brace Cervical Brace: Hard collar, At all times Restrictions Weight Bearing Restrictions: No LUE Weight Bearing: Weight bearing as tolerated   Pain: Pain Assessment Pain Assessment: No/denies pain Pain Score: 0-No pain ADL: See Function Navigator for Current Functional Status.   Therapy/Group: Individual Therapy  Marchel Foote OTR/L 03/16/2017, 12:42 PM

## 2017-03-16 NOTE — Progress Notes (Signed)
Recreational Therapy Discharge Summary Patient Details  Name: Alec Rivers MRN: [id number] Date of Birth: 1951-08-05 Today's Date: 03/16/2017  Comments on progress toward goals: Pt has made good progress during LOS and is ready for discharge home on 7/21 at intermittent supervision/Mod I level with family.  Session today discussed importance of staying active, identifying healthy leisure opportunities with potential adaptations &community reintegration education.  Pt stated understanding about safety concerns, modifications and is able to identify way to utilize his time post discharge. Reasons for discharge: discharge from hospital   Patient/family agrees with progress made and goals achieved: Yes  [name] 03/16/2017, 4:16 PM
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
## 2017-03-17 ENCOUNTER — Inpatient Hospital Stay (HOSPITAL_COMMUNITY): Payer: Medicare Other

## 2017-03-17 ENCOUNTER — Ambulatory Visit (HOSPITAL_COMMUNITY): Payer: Medicare Other

## 2017-03-17 ENCOUNTER — Inpatient Hospital Stay (HOSPITAL_COMMUNITY): Payer: Medicare Other | Admitting: Occupational Therapy

## 2017-03-17 NOTE — Progress Notes (Signed)
Social Work  Discharge Note  The overall goal for the admission was met for:   Discharge location: Yes-HOME WITH INTERMITTENT ASSIST FROM HIS CHILDREN  Length of Stay: Yes-11 DAYS  Discharge activity level: Yes-MOD/I-SUPERVISION LEVEL  Home/community participation: Yes  Services provided included: MD, RD, PT, OT, SLP, RN, CM and Pharmacy  Financial Services: Medicare  Follow-up services arranged: Home Health: [facility]-PT,OT,RN, DME: [place] and Patient/Family has no preference for HH/DME agencies  Comments (or additional information):BRANDI CAME IN AND WENT THROUGH THERAPIES WITH PT BOTH FEEL COMFORTABLE WITH HIS CARE NEEDS AND READY FOR HIM TO GO HOME. PT WILL HAVE INTERMITTENT ASSIST AT HOME BY HIS CHILDREN. DECLINED SUBSTANCE ABUSE RESOURCES  Patient/Family verbalized understanding of follow-up arrangements: Yes  Individual responsible for coordination of the follow-up plan: SELF & BRANDI-DAUGHTER  Confirmed correct DME delivered: Elease Hashimoto 03/17/2017    Elease Hashimoto

## 2017-03-17 NOTE — Progress Notes (Signed)
Social Work Patient ID: Alec Rivers, male   DOB: [birth year], 66 y.o.   MRN: [id number]  Met with pt and daughter is here to attend therpaies with pt and watch him in therapies. It is going well and discussed Equipment needs and follow up needs. All in agreement will work on finding home health agency to see him in [place], [place]. Both feel he is ready to go home on Sat.
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
## 2017-03-17 NOTE — Progress Notes (Signed)
Occupational Therapy Session Note  Patient Details  Name: Alec Rivers MRN: [id number] Date of Birth: October 09, 1950  Today's Date: 03/17/2017 OT Individual Time: 1005-1105 OT Individual Time Calculation (min): 60 min    Short Term Goals: Week 1:  OT Short Term Goal 1 (Week 1): Pt will complete all bathing sit to stand with supervision. OT Short Term Goal 2 (Week 1): Pt will complete LB dressing sit to stand with supervision. OT Short Term Goal 3 (Week 1): Pt will complete toilet transfers with supervision using the RW for support. OT Short Term Goal 4 (Week 1): Pt will complete shower/tub transfers with min assist using RW and tub bench.   OT Short Term Goal 5 (Week 1): Pt will complete LUE FM/strengthening exercises with supervision.    Skilled Therapeutic Interventions/Progress Updates:    Pt completed shower and dressing during session with daughter present for family education.  Pt able to complete transfer to the walk-in shower with supervision using the walker and remove all dirty clothing prior to shower with supervision, once sitting on the shower seat.  All bathing supervision except for washing his back, which therapist assisted with.  Completed doffing and donning cervical collar in supine rolling in order to change out wet pads and place new ones.  Pt's daughter educated on this.  He then completed dressing sit to stand on the EOB.  Discussed the need for a tub bench at discharge as well as 3:1, and daughter and pt agree with this as well.  Educated daughter on written therapy putty exercises following handout to be completed at home to increase strength in the left hand.  Also discussed encouraging pt to use the LUE as much as possible.  Pt left sitting EOB with PT present for next session.    Therapy Documentation Precautions:  Precautions Precautions: Fall Required Braces or Orthoses: Cervical Brace Cervical Brace: Hard collar, At all times Restrictions Weight Bearing  Restrictions: No LUE Weight Bearing: Weight bearing as tolerated  Pain: Pain Assessment Pain Assessment: No/denies pain Pain Score: 0-No pain ADL: See Function Navigator for Current Functional Status.   Therapy/Group: Individual Therapy  Chaniqua Brisby OTR/L 03/17/2017, 12:39 PM

## 2017-03-18 ENCOUNTER — Inpatient Hospital Stay (HOSPITAL_COMMUNITY): Payer: Medicare Other | Admitting: Occupational Therapy

## 2017-03-18 ENCOUNTER — Inpatient Hospital Stay (HOSPITAL_COMMUNITY): Payer: Medicare Other

## 2017-03-18 DIAGNOSIS — R7989 Other specified abnormal findings of blood chemistry: Secondary | ICD-10-CM

## 2017-03-18 LAB — CBC WITH DIFFERENTIAL/PLATELET
BASOS PCT: 0 %
Basophils Absolute: 0 10*3/uL (ref 0.0–0.1)
EOS ABS: 0.4 10*3/uL (ref 0.0–0.7)
Eosinophils Relative: 11 %
HCT: 33.4 % — ABNORMAL LOW (ref 39.0–52.0)
HEMOGLOBIN: 10.5 g/dL — AB (ref 13.0–17.0)
LYMPHS ABS: 1.7 10*3/uL (ref 0.7–4.0)
Lymphocytes Relative: 42 %
MCH: 29.2 pg (ref 26.0–34.0)
MCHC: 31.4 g/dL (ref 30.0–36.0)
MCV: 92.8 fL (ref 78.0–100.0)
Monocytes Absolute: 0.2 10*3/uL (ref 0.1–1.0)
Monocytes Relative: 5 %
NEUTROS PCT: 42 %
Neutro Abs: 1.7 10*3/uL (ref 1.7–7.7)
Platelets: 150 10*3/uL (ref 150–400)
RBC: 3.6 MIL/uL — AB (ref 4.22–5.81)
RDW: 17.3 % — ABNORMAL HIGH (ref 11.5–15.5)
WBC: 4 10*3/uL (ref 4.0–10.5)

## 2017-03-18 LAB — BASIC METABOLIC PANEL
ANION GAP: 9 (ref 5–15)
BUN: 29 mg/dL — ABNORMAL HIGH (ref 6–20)
CHLORIDE: 105 mmol/L (ref 101–111)
CO2: 26 mmol/L (ref 22–32)
Calcium: 9.2 mg/dL (ref 8.9–10.3)
Creatinine, Ser: 0.98 mg/dL (ref 0.61–1.24)
GFR calc Af Amer: >60 mL/min (ref >60)
GFR calc non Af Amer: >60 mL/min (ref >60)
Glucose, Bld: 84 mg/dL (ref 65–99)
POTASSIUM: 3.7 mmol/L (ref 3.5–5.1)
SODIUM: 140 mmol/L (ref 135–145)

## 2017-03-18 MED ORDER — APIXABAN 5 MG PO TABS: 5.0000 mg | ORAL_TABLET | Freq: Two times a day (BID) | ORAL | 0 refills | Status: AC

## 2017-03-18 MED ORDER — PAROXETINE HCL 20 MG PO TABS: 20.0000 mg | ORAL_TABLET | Freq: Every day | ORAL | 0 refills | Status: AC

## 2017-03-18 MED ORDER — FOLIC ACID 1 MG PO TABS: 1.0000 mg | ORAL_TABLET | Freq: Every day | ORAL | 0 refills | Status: AC

## 2017-03-18 MED ORDER — QUETIAPINE FUMARATE 50 MG PO TABS: 50.0000 mg | ORAL_TABLET | Freq: Every day | ORAL | 0 refills | Status: AC

## 2017-03-18 MED ORDER — METHOCARBAMOL 750 MG PO TABS: 750.0000 mg | ORAL_TABLET | Freq: Four times a day (QID) | ORAL | 0 refills | Status: AC | PRN

## 2017-03-18 MED ORDER — CLONAZEPAM 0.5 MG PO TABS: 0.2500 mg | ORAL_TABLET | Freq: Two times a day (BID) | ORAL | 0 refills | Status: AC

## 2017-03-18 MED ORDER — MIDODRINE HCL 5 MG PO TABS: 5.0000 mg | ORAL_TABLET | Freq: Three times a day (TID) | ORAL | 0 refills | Status: AC

## 2017-03-18 MED ORDER — AMIODARONE HCL 200 MG PO TABS: 100.0000 mg | ORAL_TABLET | Freq: Every day | ORAL | 0 refills | Status: AC

## 2017-03-18 NOTE — Progress Notes (Signed)
[facility] PHYSICAL MEDICINE & REHABILITATION     PROGRESS NOTE  Subjective/Complaints:  Pt seen laying in bed this AM.  He slept well overnight.  He notes he had a good day with his daughter in education yesterday.  He looks forward to discharge tomorrow.   ROS: Denies CP, SOB, N/V/D.  Objective: Vital Signs: Blood pressure 121/75, pulse 91, temperature 97.6 F (36.4 C), temperature source Oral, resp. rate 18, height 6' (1.829 m), weight 77.2 kg (170 lb 4.8 oz), SpO2 99 %. No results found.  Recent Labs  03/18/17 0439  WBC 4.0  HGB 10.5*  HCT 33.4*  PLT 150    Recent Labs  03/18/17 0439  NA 140  K 3.7  CL 105  GLUCOSE 84  BUN 29*  CREATININE 0.98  CALCIUM 9.2   CBG (last 3)  No results for input(s): GLUCAP in the last 72 hours.  Wt Readings from Last 3 Encounters:  03/16/17 77.2 kg (170 lb 4.8 oz)  03/08/17 78.9 kg (174 lb)    Physical Exam:  BP 121/75 (BP Location: Left Arm)   Pulse 91   Temp 97.6 F (36.4 C) (Oral)   Resp 18   Ht 6' (1.829 m) Comment: 6 ft  Wt 77.2 kg (170 lb 4.8 oz)   SpO2 99%   BMI 23.10 kg/m  Constitutional: He appears well-developedand well-nourished. NAD. HENT: Normocephalicand atraumatic.  Eyes: EOMare normal. No discharge.  Neck: +Cervical collar in place Cardiovascular: RRR. +Murmurheard. No JVD. Respiratory: Effort normal breath sounds normal.  GI: Soft. Bowel sounds are normal.  Musculoskeletal: He exhibits no edemaor tenderness.  Neurological: He is alertand oriented.  Mild hearing loss.  Motor: RUE 4+/5 prox to distal.  LUE: 4+/5 proximal to distal  Bilateral LE: 4/5 HF, KE and 4+/5 ADF/PF.  Skin: Skin is warmand dry. BLE with stasis changes. Psychiatric: He has a normal mood and affect. His behavior is normal.   Assessment/Plan: 1. Functional deficits secondary to debility which require 3+ hours per day of interdisciplinary therapy in a comprehensive inpatient rehab setting. Physiatrist is providing close  team supervision and 24 hour management of active medical problems listed below. Physiatrist and rehab team continue to assess barriers to discharge/monitor patient progress toward functional and medical goals.  Function:  Bathing Bathing position   Position: Shower  Bathing parts Body parts bathed by patient: Right arm, Left arm, Chest, Abdomen, Front perineal area, Buttocks, Right upper leg, Left upper leg, Right lower leg, Left lower leg Body parts bathed by helper: Back  Bathing assist Assist Level: Supervision or verbal cues      Upper Body Dressing/Undressing Upper body dressing   What is the patient wearing?: Pull over shirt/dress     Pull over shirt/dress - Perfomed by patient: Thread/unthread right sleeve, Thread/unthread left sleeve, Put head through opening, Pull shirt over trunk          Upper body assist Assist Level: Supervision or verbal cues      Lower Body Dressing/Undressing Lower body dressing   What is the patient wearing?: Pants, Socks, Shoes, Underwear Underwear - Performed by patient: Thread/unthread right underwear leg, Thread/unthread left underwear leg, Pull underwear up/down   Pants- Performed by patient: Thread/unthread right pants leg, Thread/unthread left pants leg, Pull pants up/down, Fasten/unfasten pants   Non-skid slipper socks- Performed by patient: Don/doff right sock, Don/doff left sock   Socks - Performed by patient: Don/doff right sock, Don/doff left sock   Shoes - Performed by patient: Don/doff  right shoe, Don/doff left shoe, Fasten right, Fasten left            Lower body assist Assist for lower body dressing: Supervision or verbal cues      Toileting Toileting   Toileting steps completed by patient: Adjust clothing prior to toileting, Performs perineal hygiene, Adjust clothing after toileting Toileting steps completed by helper: Adjust clothing after toileting Toileting Assistive Devices: Grab bar or rail  Toileting assist  Assist level: Supervision or verbal cues   Transfers Chair/bed transfer   Chair/bed transfer method: Ambulatory Chair/bed transfer assist level: No Help, no cues, assistive device, takes more than a reasonable amount of time Chair/bed transfer assistive device: Medical sales representative     Max distance: 180 ft Assist level: Supervision or verbal cues   Wheelchair   Type: Manual Max wheelchair distance: 75 Assist Level: Supervision or verbal cues  Cognition Comprehension Comprehension assist level: Follows basic conversation/direction with no assist  Expression Expression assist level: Expresses basic needs/ideas: With no assist  Social Interaction Social Interaction assist level: Interacts appropriately with others with medication or extra time (anti-anxiety, antidepressant).  Problem Solving Problem solving assist level: Solves basic problems with no assist  Memory Memory assist level: More than reasonable amount of time    Medical Problem List and Plan: 1. Functional and mobility deficitssecondary to polytrauma/debility after multiple medical  Cont CIR  C-collar for 12 weeks (~8/25), can take off with therapies only if supported  WBAT LUE 2. LLE/LUE DVT /Anticoagulation: Pharmaceutical: Other (comment)Eliquis 3. Pain Management: tylenol prn 4. Mood: LCSW to follow for evaluation and support 5. Neuropsych: This patient is not fullycapable of making decisions on hisown behalf. 6. Skin/Wound Care: Monitor stoma for healing. Maintain adequate nutritional and hydration status.  7. Fluids/Electrolytes/Nutrition: Monitor I/Os  BMP within acceptable range on 7/20  Encourage fluids  Megace started 7/11, labile, but overall improving 8. Hypotension: Monitor BP bid--continue midodrine  Controlled 7/20 9. A fib: In NSR on amiodarone. 10. Anxiety/depression: Managed by Paxil, Seroquel at bedtime and klonopin bid  11. H/o gout with flare ups: On allopurinol and  Colcyrys  12. ABLA: Resolving. Will continue to monitor.   Hb 10.5 on 7/20   Cont to monitor 13. Leukopenia: Resolved   Cont to monitor   LOS (Days) 10 A FACE TO FACE EVALUATION WAS PERFORMED  Crystel Demarco Lorie Phenix 03/18/2017 1:47 PM

## 2017-03-18 NOTE — Progress Notes (Signed)
Occupational Therapy Session Note  Patient Details  Name: Alec Rivers MRN: [id number] Date of Birth: 01/05/1951  Today's Date: 03/18/2017 OT Individual Time: 1000-1100 OT Individual Time Calculation (min): 60 min    Short Term Goals: Week 1:  OT Short Term Goal 1 (Week 1): Pt will complete all bathing sit to stand with supervision. OT Short Term Goal 2 (Week 1): Pt will complete LB dressing sit to stand with supervision. OT Short Term Goal 3 (Week 1): Pt will complete toilet transfers with supervision using the RW for support. OT Short Term Goal 4 (Week 1): Pt will complete shower/tub transfers with min assist using RW and tub bench.   OT Short Term Goal 5 (Week 1): Pt will complete LUE FM/strengthening exercises with supervision.    Skilled Therapeutic Interventions/Progress Updates:    Pt completed shower and dressing during session.  He transferred to the walk-in shower with supervision using the RW for support.  Prior to transfer he removed the majority of his clean clothing he had put on earlier and placed it next to the bed for dressing once he finished with shower.  He showered sit to stand with supervision.  Once completed, he dried off and then transferred out to the bed where he transitioned to supine in order for therapist to assist with removal of cervical collar and donning of new dry pads.  After this was completed he was able to sit EOB for dressing and complete all dressing tasks with modified independence.  Pt left sitting in wheelchair at end of session with call button and phone in reach..   Therapy Documentation Precautions:  Precautions Precautions: Fall Required Braces or Orthoses: Cervical Brace Cervical Brace: Hard collar, At all times Restrictions Weight Bearing Restrictions: No LUE Weight Bearing: Weight bearing as tolerated  Pain: No report of pain  See Function Navigator for Current Functional Status.   Therapy/Group: Individual  Therapy  Teona Vargus OTR/L 03/18/2017, 3:53 PM

## 2017-03-18 NOTE — Progress Notes (Signed)
Occupational Therapy Discharge Summary  Patient Details  Name: Alec Rivers MRN: [id number] Date of Birth: 1951/06/25  Today's Date: 03/18/2017 OT Individual Time: 9476-5465 OT Individual Time Calculation (min): 33 min    Session Note:  Pt completed functional transfers with use of the RW during session with supervision.  He was able to ambulate to the tub/shower room with supervision using the RW.  Supervision for stepping in and out of the shower.  Recommend tub bench for home for safety.  Once pt completed this he reported the need to go to the bathroom and transferred to the elevated toilet with modified independence.  Returned to room at end of session with pt left sitting on the EOB with call button and phone in reach. Pt reporting slight nausea as well.  Educated pt on need to call nursing if needed if nausea does not improve.    Patient has met 12 of 13 long term goals due to improved activity tolerance, improved balance, postural control, ability to compensate for deficits and functional use of  LEFT upper extremity.  Patient to discharge at overall Supervision level.  Patient's care partner is independent to provide the necessary physical and cognitive assistance at discharge.    Reasons goals not met: Pt did not attempt simple meal prep  Recommendation:  Patient will benefit from ongoing skilled OT services in home health setting to continue to advance functional skills in the area of BADL and Reduce care partner burden.  Pt continues to need supervision for safety with bathing tasks, secondary to needing assistance with donning and doffing cervical collar.  He also continues to need occasional cueing for safe walker use.  Decreased dynamic standing balance is still present warranting further OT to progress back to independent level for discharge home alone.    Equipment: tub bench, 3:1  Reasons for discharge: treatment goals met and discharge from hospital  Patient/family  agrees with progress made and goals achieved: Yes  OT Discharge Precautions/Restrictions  Precautions Precautions: Fall Required Braces or Orthoses: Cervical Brace Cervical Brace: Hard collar;At all times Restrictions Weight Bearing Restrictions: No LUE Weight Bearing: Weight bearing as tolerated  Pain Pain Assessment Pain Assessment: No/denies pain ADL  See Function section of chart for details  Vision Baseline Vision/History: Wears glasses Wears Glasses: Reading only Patient Visual Report: No change from baseline Vision Assessment?: No apparent visual deficits Perception  Perception: Within Functional Limits Praxis Praxis: Intact Cognition Overall Cognitive Status: Within Functional Limits for tasks assessed Arousal/Alertness: Awake/alert Orientation Level: Oriented X4 Attention: Sustained;Selective Sustained Attention: Appears intact Selective Attention: Appears intact Memory: Appears intact Awareness: Appears intact Problem Solving: Appears intact Safety/Judgment: Appears intact Comments: Will continue to further assess cognition during functional tasks.  Sensation Sensation Light Touch: Appears Intact Stereognosis: Appears Intact Hot/Cold: Appears Intact Proprioception: Appears Intact Coordination Gross Motor Movements are Fluid and Coordinated: Yes Fine Motor Movements are Fluid and Coordinated: Yes Heel Shin Test: [facility] Motor    Mobility  Transfers Transfers: Sit to Stand;Stand to Sit Sit to Stand: Five times sit to stand;With armrests;6: Modified independent (Device/Increase time) Stand to Sit: With upper extremity assist;With armrests;6: Modified independent (Device/Increase time)  Trunk/Postural Assessment  Cervical Assessment Cervical Assessment: Exceptions to [facility] (cervical flexion to the right with protraction) Thoracic Assessment Thoracic Assessment: Exceptions to [facility] (thoracik kyphosis) Lumbar Assessment Lumbar Assessment: Exceptions to [facility]  (slight lumbar flexion as well) Postural Control Postural Control: Within Functional Limits  Balance Balance Balance Assessed: Yes Static Sitting Balance Static Sitting -  Balance Support: Feet supported Static Sitting - Level of Assistance: 7: Independent Dynamic Sitting Balance Dynamic Sitting - Balance Support: Feet supported Dynamic Sitting - Level of Assistance: 6: Modified independent (Device/Increase time) Static Standing Balance Static Standing - Balance Support: During functional activity Static Standing - Level of Assistance: 6: Modified independent (Device/Increase time) Dynamic Standing Balance Dynamic Standing - Level of Assistance: 6: Modified independent (Device/Increase time) Extremity/Trunk Assessment RUE Assessment RUE Assessment: Within Functional Limits LUE Assessment LUE Assessment: Exceptions to [facility] LUE Strength LUE Overall Strength Comments: grasp 20 LBs, AROM shoulder flexion 0-100 degress.  Still with increased shoulder tighness with flexion and external rotation with shoulder flexion secondary to history of clavicular frracture   See Function Navigator for Current Functional Status.  Alanta Scobey OTR/L 03/18/2017, 4:45 PM

## 2017-03-18 NOTE — Progress Notes (Signed)
Physical Therapy Session Note  Patient Details  Name: Alec Rivers MRN: [id number] Date of Birth: 1950/11/05  Today's Date: 03/18/2017 PT Individual Time: 9024-0973 PT Individual Time Calculation (min): 45 min   Short Term Goals: Week 1:  PT Short Term Goal 1 (Week 1): STG=LTG due to short length of stay   Skilled Therapeutic Interventions/Progress Updates:    Session focused on functional transfers and gait with RW, neuro re-ed for dynamic standing balance retraining on compliant surface while performing functional reaching task (close supervision with 1 UE or no UE support) with seated breaks as needed due to hip discomfort, NMR for balance and strengthening on compliant surface while completing heel/toe raises with light UE support, and issuing HEP for LE strengthening and balance exercises including heel/ toe raises, mini squats, standing hip extension, standing hamstring curls, and standing hip abduction. Pt completed a few reps of each for review of technique.   Therapy Documentation Precautions:  Precautions Precautions: Fall Required Braces or Orthoses: Cervical Brace Cervical Brace: Hard collar, At all times Restrictions Weight Bearing Restrictions: No LUE Weight Bearing: Weight bearing as tolerated   Pain:  No reports of pain.   See Function Navigator for Current Functional Status.   Therapy/Group: Individual Therapy  Canary Brim Ivory Broad, PT, DPT  03/18/2017, 3:14 PM

## 2017-03-18 NOTE — Progress Notes (Signed)
Physical Therapy Discharge Summary  Patient Details  Name: Alec Rivers MRN: [id number] Date of Birth: [birth year]  Today's Date: 03/18/2017 PT Individual Time: 0800-0900 PT Individual Time Calculation (min): 60 min    Patient has met 7 of 7 long term goals due to improved activity tolerance, improved balance, increased strength and decreased pain.  Patient to discharge at an ambulatory level Modified Independent.   Patient's care partner is independent to provide the necessary physical assistance at discharge.  Reasons goals not met: N/A  Recommendation:  Patient will benefit from ongoing skilled PT services in home health setting to continue to advance safe functional mobility, address ongoing impairments in balance, endurance and minimize fall risk.  Equipment: RW  Reasons for discharge: treatment goals met  Patient/family agrees with progress made and goals achieved: Yes   Therapy Interventions:  Pt in recliner upon PT arrival, reports that he is still not feeling well and his stomach is upset. Pt requested to go to the bathroom, ambulated to bathroom Mod I with RW. Pt maintained standing balance, able to don/doff shorts and sit to use bathroom all mod I with RW. Pt ambulated out of the bathroom to the gym using RW and supervision for longer distance ambulation x 112f due to balance deficits. Pt performed car transfer, navigated incline, curb and uneven suraces all with supervision and use of RW. Pt ambulated around the gym and rehab apartment mod I with RW, taking breaks as needed. Pt performed all bed mobility mod I. Pt scored a 43/56 on the Berg balance test today which is an improvement of 6 points (37/56 on 7/17). Pt still limited by his balance and his score puts him in the significant fall risk category, these findings were discussed with the patient. Given these findings, also discussed the importance of using a RW anytime he is up and moving around and having someone with  his when navigating steps, uneven surfaces or walking longer distances. Pt left sitting in recliner with call bell in reach.   PT Discharge Precautions/Restrictions Precautions Precautions: Fall Required Braces or Orthoses: Cervical Brace Cervical Brace: Hard collar;At all times Restrictions Weight Bearing Restrictions: No LUE Weight Bearing: Weight bearing as tolerated Vital Signs Therapy Vitals Temp: 98.5 F (36.9 C) Temp Source: Oral Pulse Rate: 80 Resp: 18 BP: (!) 123/58 Patient Position (if appropriate): Lying Oxygen Therapy SpO2: 97 % O2 Device: Not Delivered Pain  Pt reports nausea/stomach upset but no pain Cognition Overall Cognitive Status: Within Functional Limits for tasks assessed Arousal/Alertness: Awake/alert Orientation Level: Oriented X4 Sustained Attention: Appears intact Selective Attention: Appears intact Memory: Appears intact Awareness: Appears intact Problem Solving: Appears intact Safety/Judgment: Appears intact Sensation Sensation Light Touch: Appears Intact Stereognosis: Appears Intact Hot/Cold: Appears Intact Proprioception: Appears Intact Additional Comments: B LEs Coordination Gross Motor Movements are Fluid and Coordinated: Yes Fine Motor Movements are Fluid and Coordinated: Yes Coordination and Movement Description: for B LEs Heel Shin Test: [facility]  Motor Motor - Skilled Clinical Observations: generalized weakness  Trunk/Postural Assessment  Cervical Assessment Cervical Assessment: Exceptions to [facility](cervical protraction and flexion at rest with cervical collar in place) Thoracic Assessment Thoracic Assessment: Exceptions to [facility](Thoracic kyphosis, mild scoliosis) Lumbar Assessment Lumbar Assessment: Ex[facility] to [facility](posterior pelvic tilit) Postural Control Postural Control: Within Functional Limits  Balance Berg Balance Test Sit to Stand: Able to stand without using hands and stabilize independently Standing  Unsupported: Able to stand safely 2 minutes Sitting with Back Unsupported but Feet Supported on Floor or  Stool: Able to sit safely and securely 2 minutes Stand to Sit: Controls descent by using hands Transfers: Able to transfer safely, minor use of hands Standing Unsupported with Eyes Closed: Able to stand 10 seconds with supervision Standing Ubsupported with Feet Together: Able to place feet together independently and stand for 1 minute with supervision From Standing, Reach Forward with Outstretched Arm: Can reach confidently >25 cm (10") From Standing Position, Pick up Object from Floor: Able to pick up shoe, needs supervision From Standing Position, Turn to Look Behind Over each Shoulder: Looks behind from both sides and weight shifts well Turn 360 Degrees: Able to turn 360 degrees safely but slowly Standing Unsupported, Alternately Place Feet on Step/Stool: Able to stand independently and complete 8 steps >20 seconds Standing Unsupported, One Foot in Front: Needs help to step but can hold 15 seconds Standing on One Leg: Tries to lift leg/unable to hold 3 seconds but remains standing independently Total Score: 43 Extremity Assessment  RLE Assessment RLE Assessment: Within Functional Limits (generalized weakness, grossly 3+/5 ) LLE Assessment LLE Assessment: Within Functional Limits (generalized weakness, grossly 3+/5 throughout)  See Function Navigator for Current Functional Status.  Netta Corrigan, PT, DPT 03/18/2017, 8:17 AM

## 2017-03-19 NOTE — Progress Notes (Signed)
[place] PHYSICAL MEDICINE & REHABILITATION     PROGRESS NOTE  Subjective/Complaints:  Excited about d/c  ROS: Denies CP, SOB, N/V/D.  Objective: Vital Signs: Blood pressure 115/73, pulse 77, temperature 97.9 F (36.6 C), temperature source Oral, resp. rate 18, height 6' (1.829 m), weight 75.1 kg (165 lb 9.1 oz), SpO2 99 %. No results found.  Recent Labs  03/18/17 0439  WBC 4.0  HGB 10.5*  HCT 33.4*  PLT 150    Recent Labs  03/18/17 0439  NA 140  K 3.7  CL 105  GLUCOSE 84  BUN 29*  CREATININE 0.98  CALCIUM 9.2   CBG (last 3)  No results for input(s): GLUCAP in the last 72 hours.  Wt Readings from Last 3 Encounters:  03/18/17 75.1 kg (165 lb 9.1 oz)  03/08/17 78.9 kg (174 lb)    Physical Exam:  BP 115/73 (BP Location: Left Arm)   Pulse 77   Temp 97.9 F (36.6 C) (Oral)   Resp 18   Ht 6' (1.829 m) Comment: 6 ft  Wt 75.1 kg (165 lb 9.1 oz)   SpO2 99%   BMI 22.45 kg/m  Constitutional: He appears well-developedand well-nourished. NAD. HENT: Normocephalicand atraumatic.  Eyes: EOMare normal. No discharge.  Neck: +Cervical collar in place Cardiovascular: RRR. +Murmurheard. No JVD. Respiratory: Effort normal breath sounds normal.  GI: Soft. Bowel sounds are normal.  Musculoskeletal: He exhibits no edemaor tenderness.  Neurological: He is alertand oriented.  Mild hearing loss.  Motor: RUE 4+/5 prox to distal.  LUE: 4+/5 proximal to distal  Bilateral LE: 4/5 HF, KE and 4+/5 ADF/PF.  Skin: Skin is warmand dry. BLE with stasis changes. Psychiatric: He has a normal mood and affect. His behavior is normal.   Assessment/Plan: 1. Functional deficits secondary to debility  Stable for D/C today F/u PCP in 3-4 weeks F/u PM&R 2 weeks See D/C summary See D/C instructions Function:  Bathing Bathing position   Position: Shower  Bathing parts Body parts bathed by patient: Right arm, Left arm, Chest, Abdomen, Front perineal area, Buttocks, Right upper  leg, Left upper leg, Right lower leg, Left lower leg Body parts bathed by helper: Back  Bathing assist Assist Level: Supervision or verbal cues      Upper Body Dressing/Undressing Upper body dressing   What is the patient wearing?: Pull over shirt/dress     Pull over shirt/dress - Perfomed by patient: Thread/unthread right sleeve, Thread/unthread left sleeve, Put head through opening, Pull shirt over trunk          Upper body assist Assist Level: More than reasonable time      Lower Body Dressing/Undressing Lower body dressing   What is the patient wearing?: Pants, Socks, Shoes, Underwear Underwear - Performed by patient: Thread/unthread right underwear leg, Thread/unthread left underwear leg, Pull underwear up/down   Pants- Performed by patient: Thread/unthread right pants leg, Thread/unthread left pants leg, Pull pants up/down, Fasten/unfasten pants   Non-skid slipper socks- Performed by patient: Don/doff right sock, Don/doff left sock   Socks - Performed by patient: Don/doff right sock, Don/doff left sock   Shoes - Performed by patient: Don/doff right shoe, Don/doff left shoe, Fasten right, Fasten left            Lower body assist Assist for lower body dressing: More than reasonable time      Toileting Toileting   Toileting steps completed by patient: Adjust clothing prior to toileting, Performs perineal hygiene, Adjust clothing after toileting Toileting steps  completed by helper: Adjust clothing after toileting Toileting Assistive Devices: Grab bar or rail  Toileting assist Assist level: More than reasonable time   Transfers Chair/bed transfer   Chair/bed transfer method: Ambulatory Chair/bed transfer assist level: No Help, no cues, assistive device, takes more than a reasonable amount of time Chair/bed transfer assistive device: Medical sales representative     Max distance: 180 ft Assist level: Supervision or verbal cues   Wheelchair   Type:  Manual Max wheelchair distance: 75 Assist Level: Supervision or verbal cues  Cognition Comprehension Comprehension assist level: Follows basic conversation/direction with no assist  Expression Expression assist level: Expresses basic needs/ideas: With no assist  Social Interaction Social Interaction assist level: Interacts appropriately with others with medication or extra time (anti-anxiety, antidepressant).  Problem Solving Problem solving assist level: Solves basic problems with no assist  Memory Memory assist level: More than reasonable amount of time    Medical Problem List and Plan: 1. Functional and mobility deficitssecondary to polytrauma/debility  medically stable for d/c  C-collar for 12 weeks (~8/25), can take off with therapies only if supported  WBAT LUE 2. LLE/LUE DVT /Anticoagulation: Pharmaceutical: Other (comment)Eliquis 3. Pain Management: tylenol prn 4. Mood: LCSW to follow for evaluation and support 5. Neuropsych: This patient is not fullycapable of making decisions on hisown behalf. 6. Skin/Wound Care: Monitor stoma for healing. Maintain adequate nutritional and hydration status.  7. Fluids/Electrolytes/Nutrition: Monitor I/Os  BMP within acceptable range on 7/20  Encourage fluids  Megace started 7/11, labile, but overall improving 8. Hypotension: Monitor BP bid--continue midodrine  Controlled 7/20 9. A fib: In NSR on amiodarone. 10. Anxiety/depression: Managed by Paxil, Seroquel at bedtime and klonopin bid  11. H/o gout with flare ups: On allopurinol and Colcyrys  12. ABLA: Resolving. Will continue to monitor.   Hb 10.5 on 7/20   Cont to monitor 13. Leukopenia: Resolved   Cont to monitor   LOS (Days) 11 A FACE TO FACE EVALUATION WAS PERFORMED  Michille Mcelrath E 03/19/2017 6:54 AM

## 2017-03-19 NOTE — Progress Notes (Signed)
Patient ready for discharge. His daughter arrived to transport him home. Discharge instructions and belongings are in his possession. He denies questions at this time. Nurse tech escorted patient and his daughter to lobby.

## 2017-03-22 ENCOUNTER — Telehealth: Payer: Self-pay | Admitting: *Deleted

## 2017-03-22 NOTE — Telephone Encounter (Signed)
Transitional Care call- I spoke with  Alec Rivers   1. Are you/is patient experiencing any problems since coming home? Are there any questions regarding any aspect of care? Yes. He cannot afford the Eliquis. It is over $400 and he is out of what he had this morning so no PM dose. 2. Are there any questions regarding medications administration/dosing? Are meds being taken as prescribed? Patient should review meds with caller to confirm See above 3. Have there been any falls? NO 4. Has Home Health been to the house and/or have they contacted you? If not, have you tried to contact them? Can we help you contact them? Yes they have been out 5. Are bowels and bladder emptying properly? Are there any unexpected incontinence issues? If applicable, is patient following bowel/bladder programs? NO 6. Any fevers, problems with breathing, unexpected pain? NO 7. Are there any skin problems or new areas of breakdown? NO 8. Has the patient/family member arranged specialty MD follow up (ie cardiology/neurology/renal/surgical/etc)?  Can we help arrange? Appt given for Dr Posey Pronto 9. Does the patient need any other services or support that we can help arrange? I have spoken with Dr Posey Pronto about the Eliquis issue. He has reached out to the Education officer, museum and they will contact Alec Rivers to see what assistance can be given. 10. Are caregivers following through as expected in assisting the patient? YES Has the patient quit smoking, drinking alcohol, or using drugs as recommended? YES  I will notify Alec Rivers son to expect call from Education officer, museum.  Appointment Thursday 03/31/17 , arrive 2:45 for 3:00 with Dr Posey Pronto  Address reviewed [phone]

## 2017-03-23 ENCOUNTER — Telehealth (HOSPITAL_COMMUNITY): Payer: Self-pay

## 2017-03-23 NOTE — Progress Notes (Signed)
Received call from Dr. Posey Pronto about pt report of having difficulty affording medication - Eliquis.  I spoke with pt and his son, Alec Rivers, and let them know I could provide them with a 30 day free trial card.  Son notes that pt's daughter would pick up at [facility] nursing unit - left for her yesterday afternoon and it has been picked up.  Dr. Posey Pronto aware that I have provided this and he will look into other options for longer term management.  Alec Dowson, LCSW

## 2017-03-31 ENCOUNTER — Encounter: Payer: Medicare Other | Admitting: Physical Medicine & Rehabilitation

## 2017-04-13 ENCOUNTER — Encounter: Payer: No Typology Code available for payment source | Admitting: Physical Medicine & Rehabilitation

## 2017-04-14 ENCOUNTER — Encounter: Payer: Self-pay | Admitting: Physical Medicine & Rehabilitation

## 2017-04-14 ENCOUNTER — Encounter: Payer: Medicare Other | Attending: Physical Medicine & Rehabilitation | Admitting: Physical Medicine & Rehabilitation

## 2017-04-14 VITALS — BP 103/70 | HR 98 | Resp 16 | Ht 72.0 in | Wt 171.0 lb

## 2017-04-14 DIAGNOSIS — I959 Hypotension, unspecified: Secondary | ICD-10-CM | POA: Diagnosis not present

## 2017-04-14 DIAGNOSIS — R630 Anorexia: Secondary | ICD-10-CM | POA: Diagnosis not present

## 2017-04-14 DIAGNOSIS — R269 Unspecified abnormalities of gait and mobility: Secondary | ICD-10-CM

## 2017-04-14 DIAGNOSIS — R5381 Other malaise: Secondary | ICD-10-CM | POA: Diagnosis not present

## 2017-04-14 DIAGNOSIS — T07XXXA Unspecified multiple injuries, initial encounter: Secondary | ICD-10-CM

## 2017-04-14 DIAGNOSIS — M10041 Idiopathic gout, right hand: Secondary | ICD-10-CM | POA: Diagnosis not present

## 2017-04-14 DIAGNOSIS — I82622 Acute embolism and thrombosis of deep veins of left upper extremity: Secondary | ICD-10-CM | POA: Diagnosis not present

## 2017-04-14 DIAGNOSIS — X58XXXA Exposure to other specified factors, initial encounter: Secondary | ICD-10-CM | POA: Diagnosis not present

## 2017-04-14 NOTE — Progress Notes (Signed)
Subjective:    Patient ID: Alec Rivers, male    DOB: 01/18/1951, 66 y.o.   MRN: [id number]  HPI 66 year old male with history of PAF, esophageal cancer, recent IVC filter for DVTs,  CAD, tobacco/alcohol abuse who presents for transitional care management after receiving CIR for polytrauma/debility.  Admit date: 03/08/2017 Discharge date: 03/19/2017  Family present, who provide much of the history. At discharge, he was instructed to drink fluids, which he has been. He has been abstaining from [place].  He saw PCP and Neurosurg.  His C-collar was d/ced. Denies falls. His appetite has improved, family unaware, if he is still taking Megace.  He has had gout flares and being managed by PCP.   Mobility: Cane in community Therapies: 3/week DME: Toilet seat, shower chair.  Pain Inventory Average Pain 3 Pain Right Now 2 My pain is intermittent  In the last 24 hours, has pain interfered with the following? General activity 0 Relation with others 0 Enjoyment of life 0 What TIME of day is your pain at its worst? morning Sleep (in general) Good  Pain is worse with: standing Pain improves with: rest, heat/ice, therapy/exercise and medication Relief from Meds: 8  Mobility walk without assistance use a cane use a walker how many minutes can you walk? 5-6 ability to climb steps?  yes do you drive?  no  Function not employed: date last employed n/a retired I need assistance with the following:  bathing, household duties and shopping  Neuro/Psych trouble walking  Prior Studies x-rays  Physicians involved in your care Primary care Dr. Phineas Inches Neurosurgeon Dr. Grayland Ormond    Family History  Problem Relation Age of Onset  . Hypertension Father   . Breast cancer Maternal Aunt    Social History   Social History  . Marital status: Single    Spouse name: N/A  . Number of children: 3  . Years of education: N/A   Social History Main Topics  . Smoking status: Former  Smoker    Packs/day: 0.50    Years: 40.00    Types: Cigarettes  . Smokeless tobacco: Current User  . Alcohol use No  . Drug use: No  . Sexual activity: Not Currently   Other Topics Concern  . None   Social History Narrative   Not asked   Past Surgical History:  Procedure Laterality Date  . ESOPHAGECTOMY  2013  . IVC FILTER INSERTION  06/11/2016   Past Medical History:  Diagnosis Date  . Abdominal aortic aneurysm (AAA), 30-34 mm diameter ([facility])   . Alcohol abuse   . Alcohol withdrawal seizure with complication ([place])   . Aortic root dilation ([facility])   . CAD (coronary artery disease)   . DVT, bilateral lower limbs ([place]) 03/2016  . Esophageal adenocarcinoma ([place])    treated with chemoradiation  . GERD (gastroesophageal reflux disease)   . Gout   . HTN (hypertension)   . PAF (paroxysmal atrial fibrillation) ([facility])    s/p ablation 2013  . Spinal stenosis, lumbar region with neurogenic claudication   . Subdural fluid collection 2017   Fall/coumadin/Etoh abuse  . Vitamin B 12 deficiency    supplemented by B 12 injections   BP 103/70   Pulse 98   Resp 16   Ht 6' (1.829 m)   Wt 171 lb (77.6 kg)   SpO2 95%   BMI 23.19 kg/m   Opioid Risk Score:   Fall Risk Score:  `1  Depression screen [facility] 2/9  Depression screen [facility] 2/9 04/14/2017 04/14/2017  Decreased Interest 0 0  Down, Depressed, Hopeless 0 0  PHQ - 2 Score 0 0  Altered sleeping 0 -  Tired, decreased energy 0 -  Change in appetite 0 -  Feeling bad or failure about yourself  0 -  Trouble concentrating 0 -  Moving slowly or fidgety/restless 0 -  Suicidal thoughts 0 -  PHQ-9 Score 0 -  Difficult doing work/chores Not difficult at all -    Review of Systems  Musculoskeletal: Positive for gait problem.  Neurological: Negative for dizziness and weakness.  All other systems reviewed and are negative.     Objective:   Physical Exam Constitutional: He appears well-developed. NAD. HENT: Normocephalic and  atraumatic.  Eyes: EOM are normal. No discharge.  Cardiovascular: RRR. +Murmur heard. No JVD. Respiratory: Effort normal breath sounds normal.  GI: Soft. Bowel sounds are normal.  Musculoskeletal: He exhibits no edema or tenderness.  Neurological: He is alert and oriented.  HOH.  Motor: RUE 4+/5 prox to distal.  LUE: 4+/5 proximal to distal  Bilateral LE: 4+/5 HF, KE ADF/PF.  Skin: Skin is warm and dry. BLE with stasis changes.  Psychiatric: Flat. Slowed    Assessment & Plan:  66 year old male with history of PAF, esophageal cancer, recent IVC filter for DVTs,  CAD, tobacco/alcohol abuse who presents for transitional care management after receiving CIR for polytrauma/debility.  1. Functional and mobility deficits secondary to polytrauma/debility  Cont therapies  Released by Neurosurg  2.  LLE/LUE  DVT   Cont Eliquis  3. Pain Management  Controlled without meds at present  4.  Nutrition  Wean Megace over 1 week and then d/c - pt and family unaware  5. Hypotension  Cont continue midodrine per PCP  6. Gout   Cont meds per PCP  7. Gait abnormality  Cont Cane for safety  Cont therapies  Meds reviewed Referrals reviewed All questions answered

## 2017-06-15 ENCOUNTER — Encounter: Payer: Medicare Other | Attending: Physical Medicine & Rehabilitation | Admitting: Physical Medicine & Rehabilitation

## 2017-06-15 DIAGNOSIS — R5381 Other malaise: Secondary | ICD-10-CM | POA: Insufficient documentation

## 2017-06-15 DIAGNOSIS — T07XXXA Unspecified multiple injuries, initial encounter: Secondary | ICD-10-CM | POA: Insufficient documentation

## 2017-08-29 IMAGING — DX DG CHEST 1V PORT
1 series · 1 of 1 positions shown · non-contrast
Comparison: 02/17/2017.

CLINICAL DATA: Respiratory failure .

EXAM:
PORTABLE CHEST 1 VIEW

[chest ap]
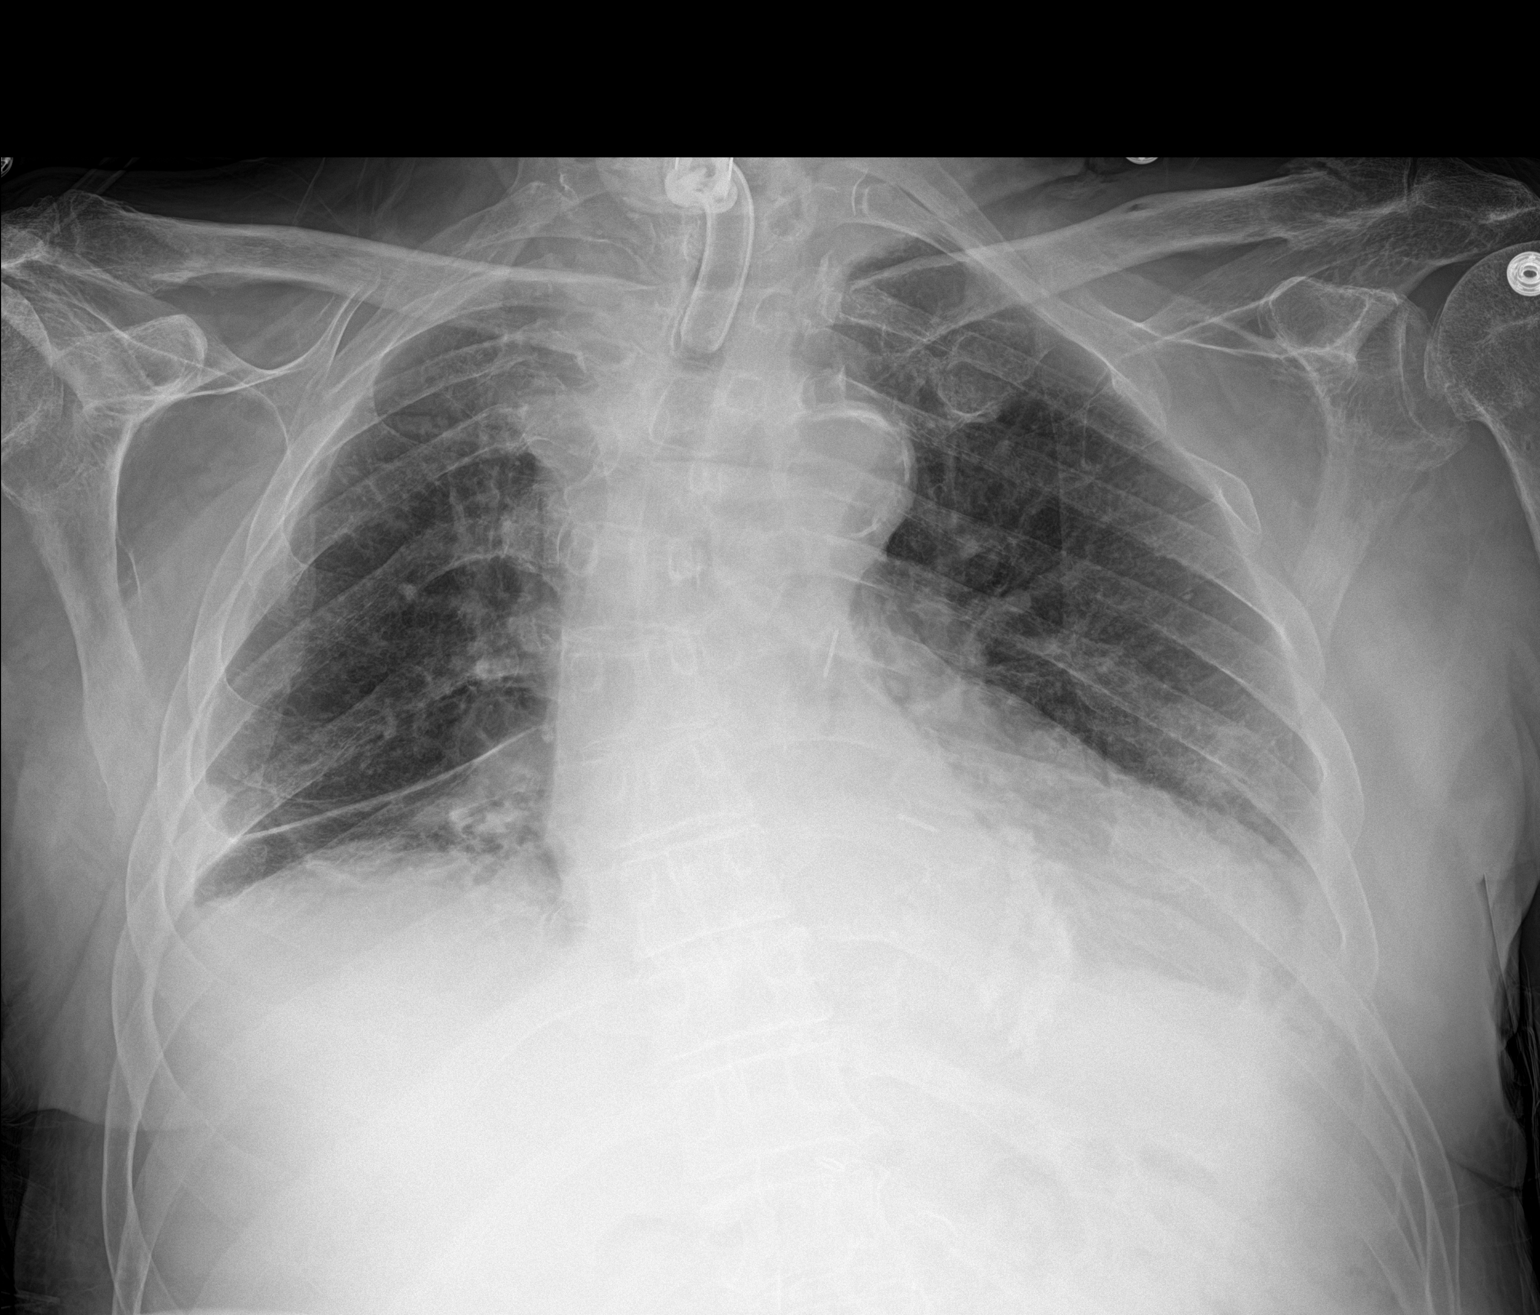

[1 of 1 positions shown; findings below may reference images not displayed]

FINDINGS: Interim removal of NG tube. Tracheostomy tube in stable position.
Cardiomegaly with normal pulmonary vascularity. Aortic vascular
calcification. Calcific density noted over the left lower chest
possibly cardiac calcification. Low lung volumes with basilar
atelectasis. Small bilateral pleural effusions. No pneumothorax.
Prior cervical spine fusion.
IMPRESSION: 1. Interim removal of NG tube. Tracheostomy tube in stable position
.

2. Persistent low lung volumes with basilar atelectasis. Small
bilateral pleural effusions.

3. Stable cardiomegaly.

## 2018-09-30 DEATH — deceased
